# Patient Record
Sex: Female | Born: 1960 | Race: Black or African American | Hispanic: No | Marital: Single
Health system: Southern US, Community
[De-identification: ages and names within clinical notes are randomized; demographics above are authoritative.]

---

## 2004-11-02 ENCOUNTER — Ambulatory Visit: Payer: Self-pay | Admitting: Family Medicine

## 2005-02-14 ENCOUNTER — Ambulatory Visit: Payer: Self-pay | Admitting: Family Medicine

## 2006-02-20 ENCOUNTER — Ambulatory Visit: Payer: Self-pay | Admitting: Family Medicine

## 2006-06-13 ENCOUNTER — Ambulatory Visit: Payer: Self-pay | Admitting: Family Medicine

## 2009-03-10 ENCOUNTER — Ambulatory Visit: Payer: Self-pay | Admitting: Family Medicine

## 2009-03-16 ENCOUNTER — Ambulatory Visit: Payer: Self-pay | Admitting: Family Medicine

## 2009-03-24 ENCOUNTER — Emergency Department: Payer: Self-pay | Admitting: Emergency Medicine

## 2009-06-21 ENCOUNTER — Ambulatory Visit: Payer: Self-pay | Admitting: Gastroenterology

## 2009-06-23 ENCOUNTER — Inpatient Hospital Stay: Payer: Self-pay | Admitting: Internal Medicine

## 2009-07-26 ENCOUNTER — Ambulatory Visit: Payer: Self-pay | Admitting: General Surgery

## 2009-08-18 ENCOUNTER — Ambulatory Visit: Payer: Self-pay | Admitting: Gastroenterology

## 2009-08-26 ENCOUNTER — Ambulatory Visit: Payer: Self-pay | Admitting: Oncology

## 2009-09-01 ENCOUNTER — Ambulatory Visit: Payer: Self-pay | Admitting: General Surgery

## 2009-09-25 ENCOUNTER — Ambulatory Visit: Payer: Self-pay | Admitting: Internal Medicine

## 2009-09-25 ENCOUNTER — Ambulatory Visit: Payer: Self-pay | Admitting: Oncology

## 2009-09-25 ENCOUNTER — Ambulatory Visit: Payer: Self-pay | Admitting: General Surgery

## 2009-10-26 ENCOUNTER — Ambulatory Visit: Payer: Self-pay | Admitting: Oncology

## 2009-10-26 ENCOUNTER — Ambulatory Visit: Payer: Self-pay | Admitting: Internal Medicine

## 2009-11-10 ENCOUNTER — Ambulatory Visit: Payer: Self-pay | Admitting: Oncology

## 2009-11-25 ENCOUNTER — Ambulatory Visit: Payer: Self-pay | Admitting: Internal Medicine

## 2009-12-01 ENCOUNTER — Ambulatory Visit: Payer: Self-pay | Admitting: General Surgery

## 2009-12-26 ENCOUNTER — Ambulatory Visit: Payer: Self-pay | Admitting: Oncology

## 2009-12-26 ENCOUNTER — Ambulatory Visit: Payer: Self-pay | Admitting: Internal Medicine

## 2009-12-29 ENCOUNTER — Ambulatory Visit: Payer: Self-pay | Admitting: Internal Medicine

## 2010-01-12 ENCOUNTER — Ambulatory Visit: Payer: Self-pay | Admitting: Oncology

## 2010-01-26 ENCOUNTER — Ambulatory Visit: Payer: Self-pay | Admitting: Oncology

## 2010-01-26 ENCOUNTER — Ambulatory Visit: Payer: Self-pay | Admitting: Internal Medicine

## 2010-01-31 ENCOUNTER — Ambulatory Visit: Payer: Self-pay | Admitting: Surgery

## 2010-02-25 ENCOUNTER — Ambulatory Visit: Payer: Self-pay | Admitting: Oncology

## 2010-02-25 ENCOUNTER — Ambulatory Visit: Payer: Self-pay | Admitting: Internal Medicine

## 2010-03-28 ENCOUNTER — Ambulatory Visit: Payer: Self-pay | Admitting: Oncology

## 2010-03-28 ENCOUNTER — Ambulatory Visit: Payer: Self-pay | Admitting: Internal Medicine

## 2010-04-27 ENCOUNTER — Ambulatory Visit: Payer: Self-pay | Admitting: Internal Medicine

## 2010-04-27 ENCOUNTER — Ambulatory Visit: Payer: Self-pay | Admitting: Oncology

## 2010-05-28 ENCOUNTER — Ambulatory Visit: Payer: Self-pay | Admitting: Internal Medicine

## 2010-05-28 ENCOUNTER — Ambulatory Visit: Payer: Self-pay | Admitting: Oncology

## 2010-06-28 ENCOUNTER — Ambulatory Visit: Payer: Self-pay | Admitting: Oncology

## 2010-06-28 ENCOUNTER — Ambulatory Visit: Payer: Self-pay | Admitting: Internal Medicine

## 2010-07-27 ENCOUNTER — Ambulatory Visit: Payer: Self-pay | Admitting: Oncology

## 2010-07-27 ENCOUNTER — Ambulatory Visit: Payer: Self-pay | Admitting: Internal Medicine

## 2010-08-27 ENCOUNTER — Ambulatory Visit: Payer: Self-pay | Admitting: Oncology

## 2010-08-27 ENCOUNTER — Ambulatory Visit: Payer: Self-pay | Admitting: Internal Medicine

## 2010-09-26 ENCOUNTER — Ambulatory Visit: Payer: Self-pay | Admitting: Internal Medicine

## 2010-09-26 ENCOUNTER — Ambulatory Visit: Payer: Self-pay | Admitting: Oncology

## 2010-10-27 ENCOUNTER — Ambulatory Visit: Payer: Self-pay | Admitting: Oncology

## 2010-10-27 ENCOUNTER — Ambulatory Visit: Payer: Self-pay | Admitting: Internal Medicine

## 2010-11-26 ENCOUNTER — Ambulatory Visit: Payer: Self-pay | Admitting: Internal Medicine

## 2010-11-26 ENCOUNTER — Ambulatory Visit: Payer: Self-pay | Admitting: Oncology

## 2010-12-27 ENCOUNTER — Ambulatory Visit: Payer: Self-pay | Admitting: Internal Medicine

## 2010-12-27 ENCOUNTER — Ambulatory Visit: Payer: Self-pay | Admitting: Oncology

## 2011-01-27 ENCOUNTER — Ambulatory Visit: Payer: Self-pay | Admitting: Oncology

## 2011-01-27 ENCOUNTER — Ambulatory Visit: Payer: Self-pay | Admitting: Internal Medicine

## 2011-02-26 ENCOUNTER — Ambulatory Visit: Payer: Self-pay | Admitting: Oncology

## 2011-02-26 ENCOUNTER — Ambulatory Visit: Payer: Self-pay | Admitting: Internal Medicine

## 2011-03-06 ENCOUNTER — Ambulatory Visit: Payer: Self-pay | Admitting: General Surgery

## 2011-03-12 ENCOUNTER — Ambulatory Visit: Payer: Self-pay | Admitting: General Surgery

## 2011-03-14 LAB — PATHOLOGY REPORT

## 2011-03-29 ENCOUNTER — Ambulatory Visit: Payer: Self-pay | Admitting: Oncology

## 2011-06-19 ENCOUNTER — Ambulatory Visit: Payer: Self-pay | Admitting: Oncology

## 2011-06-19 LAB — COMPREHENSIVE METABOLIC PANEL
Albumin: 2.4 g/dL — ABNORMAL LOW (ref 3.4–5.0)
Alkaline Phosphatase: 95 U/L (ref 50–136)
BUN: 9 mg/dL (ref 7–18)
Creatinine: 1.22 mg/dL (ref 0.60–1.30)
EGFR (Non-African Amer.): 49 — ABNORMAL LOW
Glucose: 151 mg/dL — ABNORMAL HIGH (ref 65–99)
Osmolality: 290 (ref 275–301)
SGOT(AST): 16 U/L (ref 15–37)
SGPT (ALT): 10 U/L — ABNORMAL LOW
Sodium: 145 mmol/L (ref 136–145)
Total Protein: 6.3 g/dL — ABNORMAL LOW (ref 6.4–8.2)

## 2011-06-19 LAB — CBC CANCER CENTER
Basophil %: 1 %
Eosinophil #: 0.1 x10 3/mm (ref 0.0–0.7)
Eosinophil %: 1.4 %
HCT: 26.9 % — ABNORMAL LOW (ref 35.0–47.0)
HGB: 8.8 g/dL — ABNORMAL LOW (ref 12.0–16.0)
MCH: 30.3 pg (ref 26.0–34.0)
MCHC: 32.8 g/dL (ref 32.0–36.0)
MCV: 92 fL (ref 80–100)
Monocyte #: 0.5 x10 3/mm (ref 0.0–0.7)
Neutrophil #: 3.9 x10 3/mm (ref 1.4–6.5)
Neutrophil %: 68.6 %
Platelet: 216 x10 3/mm (ref 150–440)
RBC: 2.92 10*6/uL — ABNORMAL LOW (ref 3.80–5.20)

## 2011-06-29 ENCOUNTER — Ambulatory Visit: Payer: Self-pay | Admitting: Oncology

## 2011-07-12 LAB — CBC CANCER CENTER
Basophil %: 0.2 %
Eosinophil #: 0.1 x10 3/mm (ref 0.0–0.7)
HCT: 27.6 % — ABNORMAL LOW (ref 35.0–47.0)
HGB: 9 g/dL — ABNORMAL LOW (ref 12.0–16.0)
Lymphocyte #: 0.9 x10 3/mm — ABNORMAL LOW (ref 1.0–3.6)
Lymphocyte %: 17.6 %
MCH: 30.4 pg (ref 26.0–34.0)
MCHC: 32.8 g/dL (ref 32.0–36.0)
MCV: 93 fL (ref 80–100)
Monocyte #: 0.6 x10 3/mm (ref 0.0–0.7)
Neutrophil #: 3.6 x10 3/mm (ref 1.4–6.5)
RBC: 2.97 10*6/uL — ABNORMAL LOW (ref 3.80–5.20)
RDW: 15.4 % — ABNORMAL HIGH (ref 11.5–14.5)
WBC: 5.2 x10 3/mm (ref 3.6–11.0)

## 2011-07-12 LAB — COMPREHENSIVE METABOLIC PANEL
Albumin: 2.4 g/dL — ABNORMAL LOW (ref 3.4–5.0)
Alkaline Phosphatase: 403 U/L — ABNORMAL HIGH (ref 50–136)
Anion Gap: 8 (ref 7–16)
Bilirubin,Total: 0.2 mg/dL (ref 0.2–1.0)
Calcium, Total: 7.8 mg/dL — ABNORMAL LOW (ref 8.5–10.1)
Creatinine: 1.08 mg/dL (ref 0.60–1.30)
Glucose: 83 mg/dL (ref 65–99)
Osmolality: 282 (ref 275–301)
Potassium: 3.6 mmol/L (ref 3.5–5.1)
Sodium: 143 mmol/L (ref 136–145)
Total Protein: 6.9 g/dL (ref 6.4–8.2)

## 2011-07-26 LAB — CBC CANCER CENTER
Eosinophil: 5 %
HCT: 26.4 % — ABNORMAL LOW (ref 35.0–47.0)
Lymphocytes: 25 %
Platelet: 173 x10 3/mm (ref 150–440)
RDW: 14.8 % — ABNORMAL HIGH (ref 11.5–14.5)
Segmented Neutrophils: 63 %
WBC: 3.1 x10 3/mm — ABNORMAL LOW (ref 3.6–11.0)

## 2011-07-27 ENCOUNTER — Ambulatory Visit: Payer: Self-pay | Admitting: Oncology

## 2011-08-02 LAB — BASIC METABOLIC PANEL
Anion Gap: 9 (ref 7–16)
BUN: 7 mg/dL (ref 7–18)
Calcium, Total: 8 mg/dL — ABNORMAL LOW (ref 8.5–10.1)
Chloride: 106 mmol/L (ref 98–107)
Co2: 27 mmol/L (ref 21–32)
Creatinine: 0.97 mg/dL (ref 0.60–1.30)
Potassium: 3.3 mmol/L — ABNORMAL LOW (ref 3.5–5.1)

## 2011-08-02 LAB — CBC CANCER CENTER
Eosinophil: 3 %
Lymphocytes: 24 %
MCV: 93 fL (ref 80–100)
Monocytes: 14 %
Platelet: 249 x10 3/mm (ref 150–440)
RBC: 2.8 10*6/uL — ABNORMAL LOW (ref 3.80–5.20)
RDW: 16 % — ABNORMAL HIGH (ref 11.5–14.5)
Segmented Neutrophils: 57 %
WBC: 3.8 x10 3/mm (ref 3.6–11.0)

## 2011-08-09 LAB — CBC CANCER CENTER
Bands: 4 %
Basophil #: 0 x10 3/mm (ref 0.0–0.1)
Basophil %: 0.7 %
Eosinophil #: 0.1 x10 3/mm (ref 0.0–0.7)
Eosinophil %: 1.6 %
Eosinophil: 1 %
HCT: 27.8 % — ABNORMAL LOW (ref 35.0–47.0)
HGB: 9.2 g/dL — ABNORMAL LOW (ref 12.0–16.0)
Lymphocyte %: 18.7 %
Lymphocytes: 24 %
MCH: 31.4 pg (ref 26.0–34.0)
MCHC: 33.1 g/dL (ref 32.0–36.0)
Metamyelocyte: 1 %
Neutrophil #: 2.2 x10 3/mm (ref 1.4–6.5)
Neutrophil %: 49.7 %
RDW: 16.9 % — ABNORMAL HIGH (ref 11.5–14.5)
Segmented Neutrophils: 42 %

## 2011-08-16 LAB — BASIC METABOLIC PANEL
Anion Gap: 10 (ref 7–16)
BUN: 9 mg/dL (ref 7–18)
Calcium, Total: 8.1 mg/dL — ABNORMAL LOW (ref 8.5–10.1)
Co2: 31 mmol/L (ref 21–32)
EGFR (African American): >60
EGFR (Non-African Amer.): >60
Glucose: 103 mg/dL — ABNORMAL HIGH (ref 65–99)
Sodium: 145 mmol/L (ref 136–145)

## 2011-08-16 LAB — CBC CANCER CENTER
Eosinophil %: 0.7 %
HCT: 28.9 % — ABNORMAL LOW (ref 35.0–47.0)
Lymphocyte %: 16.2 %
Monocyte %: 10.8 %
Neutrophil #: 7.2 x10 3/mm — ABNORMAL HIGH (ref 1.4–6.5)
Neutrophil %: 72.2 %
Platelet: 250 x10 3/mm (ref 150–440)
RBC: 3.07 10*6/uL — ABNORMAL LOW (ref 3.80–5.20)
WBC: 10 x10 3/mm (ref 3.6–11.0)

## 2011-08-27 ENCOUNTER — Ambulatory Visit: Payer: Self-pay | Admitting: Oncology

## 2011-08-30 LAB — CBC CANCER CENTER
Basophil #: 0 x10 3/mm (ref 0.0–0.1)
Basophil %: 0.1 %
HGB: 9.2 g/dL — ABNORMAL LOW (ref 12.0–16.0)
Lymphocyte #: 0.9 x10 3/mm — ABNORMAL LOW (ref 1.0–3.6)
Lymphocyte %: 13.1 %
MCHC: 33.5 g/dL (ref 32.0–36.0)
MCV: 95 fL (ref 80–100)
Monocyte %: 10.6 %
Neutrophil %: 73.4 %
Platelet: 230 x10 3/mm (ref 150–440)
WBC: 7.1 x10 3/mm (ref 3.6–11.0)

## 2011-08-30 LAB — BASIC METABOLIC PANEL
Anion Gap: 9 (ref 7–16)
Calcium, Total: 8 mg/dL — ABNORMAL LOW (ref 8.5–10.1)
Chloride: 109 mmol/L — ABNORMAL HIGH (ref 98–107)
Co2: 26 mmol/L (ref 21–32)
Creatinine: 1.19 mg/dL (ref 0.60–1.30)
EGFR (African American): >60
Osmolality: 287 (ref 275–301)
Potassium: 3.4 mmol/L — ABNORMAL LOW (ref 3.5–5.1)

## 2011-09-13 LAB — CBC CANCER CENTER
Eosinophil #: 0.1 x10 3/mm (ref 0.0–0.7)
Eosinophil %: 1.7 %
HCT: 29.6 % — ABNORMAL LOW (ref 35.0–47.0)
HGB: 9.6 g/dL — ABNORMAL LOW (ref 12.0–16.0)
Lymphocyte #: 1 x10 3/mm (ref 1.0–3.6)
Lymphocyte %: 18.5 %
MCHC: 32.5 g/dL (ref 32.0–36.0)
MCV: 98 fL (ref 80–100)
Monocyte #: 0.7 x10 3/mm (ref 0.2–0.9)
Monocyte %: 11.9 %
Neutrophil %: 66.9 %
WBC: 5.7 x10 3/mm (ref 3.6–11.0)

## 2011-09-13 LAB — BASIC METABOLIC PANEL
Anion Gap: 8 (ref 7–16)
BUN: 7 mg/dL (ref 7–18)
Chloride: 105 mmol/L (ref 98–107)
EGFR (African American): >60
EGFR (Non-African Amer.): >60
Glucose: 145 mg/dL — ABNORMAL HIGH (ref 65–99)
Osmolality: 286 (ref 275–301)
Potassium: 3.2 mmol/L — ABNORMAL LOW (ref 3.5–5.1)

## 2011-09-26 ENCOUNTER — Ambulatory Visit: Payer: Self-pay | Admitting: Oncology

## 2011-09-27 LAB — CBC CANCER CENTER
Basophil %: 0.9 %
Eosinophil #: 0.1 x10 3/mm (ref 0.0–0.7)
Eosinophil %: 1.7 %
MCHC: 31.6 g/dL — ABNORMAL LOW (ref 32.0–36.0)
MCV: 100 fL (ref 80–100)
Monocyte #: 0.7 x10 3/mm (ref 0.2–0.9)
WBC: 5.3 x10 3/mm (ref 3.6–11.0)

## 2011-09-27 LAB — BASIC METABOLIC PANEL
Anion Gap: 6 — ABNORMAL LOW (ref 7–16)
BUN: 7 mg/dL (ref 7–18)
Calcium, Total: 8 mg/dL — ABNORMAL LOW (ref 8.5–10.1)
Creatinine: 0.98 mg/dL (ref 0.60–1.30)
EGFR (Non-African Amer.): >60
Osmolality: 282 (ref 275–301)
Potassium: 4.1 mmol/L (ref 3.5–5.1)

## 2011-10-11 LAB — COMPREHENSIVE METABOLIC PANEL
Albumin: 2.2 g/dL — ABNORMAL LOW (ref 3.4–5.0)
Alkaline Phosphatase: 421 U/L — ABNORMAL HIGH (ref 50–136)
Anion Gap: 8 (ref 7–16)
BUN: 9 mg/dL (ref 7–18)
Chloride: 108 mmol/L — ABNORMAL HIGH (ref 98–107)
Co2: 27 mmol/L (ref 21–32)
Creatinine: 1.16 mg/dL (ref 0.60–1.30)
EGFR (African American): >60
EGFR (Non-African Amer.): 54 — ABNORMAL LOW
Glucose: 95 mg/dL (ref 65–99)
Osmolality: 283 (ref 275–301)
Potassium: 3.8 mmol/L (ref 3.5–5.1)
SGOT(AST): 27 U/L (ref 15–37)
Sodium: 143 mmol/L (ref 136–145)

## 2011-10-11 LAB — CBC CANCER CENTER
Eosinophil #: 0 x10 3/mm (ref 0.0–0.7)
HCT: 28.5 % — ABNORMAL LOW (ref 35.0–47.0)
HGB: 9.3 g/dL — ABNORMAL LOW (ref 12.0–16.0)
Lymphocyte #: 0.8 x10 3/mm — ABNORMAL LOW (ref 1.0–3.6)
Lymphocyte %: 16.2 %
MCH: 32.5 pg (ref 26.0–34.0)
MCHC: 32.7 g/dL (ref 32.0–36.0)
MCV: 99 fL (ref 80–100)
Monocyte #: 0.4 x10 3/mm (ref 0.2–0.9)
Monocyte %: 9.1 %
Neutrophil #: 3.4 x10 3/mm (ref 1.4–6.5)
Platelet: 170 x10 3/mm (ref 150–440)
RBC: 2.87 10*6/uL — ABNORMAL LOW (ref 3.80–5.20)

## 2011-10-16 LAB — CANCER ANTIGEN 19-9: CA 19-9: <1 U/mL

## 2011-10-18 LAB — CBC CANCER CENTER
Basophil #: 0 x10 3/mm (ref 0.0–0.1)
Eosinophil #: 0 x10 3/mm (ref 0.0–0.7)
HGB: 9 g/dL — ABNORMAL LOW (ref 12.0–16.0)
Lymphocyte #: 1.1 x10 3/mm (ref 1.0–3.6)
Lymphocyte %: 31.9 %
MCH: 32.4 pg (ref 26.0–34.0)
MCHC: 32.3 g/dL (ref 32.0–36.0)
MCV: 100 fL (ref 80–100)
Monocyte #: 0.6 x10 3/mm (ref 0.2–0.9)
Monocyte %: 18 %
Platelet: 196 x10 3/mm (ref 150–440)
RDW: 16.5 % — ABNORMAL HIGH (ref 11.5–14.5)

## 2011-10-18 LAB — COMPREHENSIVE METABOLIC PANEL
Albumin: 2.1 g/dL — ABNORMAL LOW (ref 3.4–5.0)
Alkaline Phosphatase: 380 U/L — ABNORMAL HIGH (ref 50–136)
Calcium, Total: 7.3 mg/dL — ABNORMAL LOW (ref 8.5–10.1)
Creatinine: 0.94 mg/dL (ref 0.60–1.30)
EGFR (African American): >60
EGFR (Non-African Amer.): >60
Glucose: 122 mg/dL — ABNORMAL HIGH (ref 65–99)
Osmolality: 290 (ref 275–301)
Potassium: 3.1 mmol/L — ABNORMAL LOW (ref 3.5–5.1)
SGPT (ALT): 11 U/L — ABNORMAL LOW
Sodium: 146 mmol/L — ABNORMAL HIGH (ref 136–145)
Total Protein: 6.6 g/dL (ref 6.4–8.2)

## 2011-10-18 LAB — URINALYSIS, COMPLETE
Bacteria: NONE SEEN
Bilirubin,UR: NEGATIVE
Blood: NEGATIVE
Glucose,UR: NEGATIVE mg/dL (ref 0–75)
Nitrite: NEGATIVE
Protein: NEGATIVE
Specific Gravity: 1.008 (ref 1.003–1.030)
WBC UR: <1 /HPF (ref 0–5)

## 2011-10-27 ENCOUNTER — Ambulatory Visit: Payer: Self-pay | Admitting: Oncology

## 2011-10-31 LAB — CBC CANCER CENTER
Basophil %: 1.1 %
Eosinophil #: 0 x10 3/mm (ref 0.0–0.7)
Eosinophil %: 0.9 %
HGB: 9.2 g/dL — ABNORMAL LOW (ref 12.0–16.0)
Lymphocyte #: 1.2 x10 3/mm (ref 1.0–3.6)
Lymphocyte %: 24.3 %
MCHC: 32.6 g/dL (ref 32.0–36.0)
MCV: 97 fL (ref 80–100)
Monocyte #: 0.7 x10 3/mm (ref 0.2–0.9)
Platelet: 271 x10 3/mm (ref 150–440)
RDW: 15.8 % — ABNORMAL HIGH (ref 11.5–14.5)

## 2011-10-31 LAB — COMPREHENSIVE METABOLIC PANEL
Albumin: 2.1 g/dL — ABNORMAL LOW (ref 3.4–5.0)
Alkaline Phosphatase: 541 U/L — ABNORMAL HIGH (ref 50–136)
Anion Gap: 6 — ABNORMAL LOW (ref 7–16)
BUN: 10 mg/dL (ref 7–18)
Calcium, Total: 7.5 mg/dL — ABNORMAL LOW (ref 8.5–10.1)
Chloride: 110 mmol/L — ABNORMAL HIGH (ref 98–107)
Co2: 29 mmol/L (ref 21–32)
EGFR (African American): >60
EGFR (Non-African Amer.): >60
Glucose: 83 mg/dL (ref 65–99)
Osmolality: 287 (ref 275–301)
SGPT (ALT): 22 U/L
Sodium: 145 mmol/L (ref 136–145)

## 2011-11-14 LAB — COMPREHENSIVE METABOLIC PANEL
Albumin: 1.8 g/dL — ABNORMAL LOW (ref 3.4–5.0)
Calcium, Total: 7.5 mg/dL — ABNORMAL LOW (ref 8.5–10.1)
Chloride: 115 mmol/L — ABNORMAL HIGH (ref 98–107)
Creatinine: 1.3 mg/dL (ref 0.60–1.30)
EGFR (African American): 55 — ABNORMAL LOW
EGFR (Non-African Amer.): 47 — ABNORMAL LOW
Glucose: 119 mg/dL — ABNORMAL HIGH (ref 65–99)
Osmolality: 292 (ref 275–301)
Potassium: 3.8 mmol/L (ref 3.5–5.1)
SGOT(AST): 21 U/L (ref 15–37)
Sodium: 147 mmol/L — ABNORMAL HIGH (ref 136–145)
Total Protein: 6.5 g/dL (ref 6.4–8.2)

## 2011-11-14 LAB — CBC CANCER CENTER
Basophil #: 0 x10 3/mm (ref 0.0–0.1)
Eosinophil #: 0.1 x10 3/mm (ref 0.0–0.7)
Eosinophil %: 1.2 %
HCT: 24.2 % — ABNORMAL LOW (ref 35.0–47.0)
MCHC: 32.5 g/dL (ref 32.0–36.0)
RDW: 16.3 % — ABNORMAL HIGH (ref 11.5–14.5)
WBC: 4.8 x10 3/mm (ref 3.6–11.0)

## 2011-11-19 LAB — CBC CANCER CENTER
Basophil #: 0.1 x10 3/mm (ref 0.0–0.1)
Basophil %: 1.1 %
Eosinophil #: 0.1 x10 3/mm (ref 0.0–0.7)
Eosinophil %: 1.3 %
HCT: 24.5 % — ABNORMAL LOW (ref 35.0–47.0)
HGB: 7.9 g/dL — ABNORMAL LOW (ref 12.0–16.0)
Lymphocyte #: 1 x10 3/mm (ref 1.0–3.6)
Lymphocyte %: 20.9 %
MCH: 31.8 pg (ref 26.0–34.0)
MCHC: 32.1 g/dL (ref 32.0–36.0)
Monocyte #: 0.7 x10 3/mm (ref 0.2–0.9)
Neutrophil #: 2.9 x10 3/mm (ref 1.4–6.5)
Platelet: 508 x10 3/mm — ABNORMAL HIGH (ref 150–440)
RBC: 2.48 10*6/uL — ABNORMAL LOW (ref 3.80–5.20)
RDW: 18 % — ABNORMAL HIGH (ref 11.5–14.5)

## 2011-11-26 ENCOUNTER — Ambulatory Visit: Payer: Self-pay | Admitting: Oncology

## 2011-11-26 LAB — COMPREHENSIVE METABOLIC PANEL
Albumin: 1.8 g/dL — ABNORMAL LOW (ref 3.4–5.0)
Alkaline Phosphatase: 578 U/L — ABNORMAL HIGH (ref 50–136)
Anion Gap: 8 (ref 7–16)
Bilirubin,Total: 0.5 mg/dL (ref 0.2–1.0)
Chloride: 111 mmol/L — ABNORMAL HIGH (ref 98–107)
EGFR (African American): 51 — ABNORMAL LOW
Glucose: 111 mg/dL — ABNORMAL HIGH (ref 65–99)
Osmolality: 287 (ref 275–301)
Potassium: 3.4 mmol/L — ABNORMAL LOW (ref 3.5–5.1)
Sodium: 145 mmol/L (ref 136–145)
Total Protein: 6.5 g/dL (ref 6.4–8.2)

## 2011-11-26 LAB — CBC CANCER CENTER
Basophil %: 1 %
Eosinophil #: 0 x10 3/mm (ref 0.0–0.7)
Eosinophil %: 0.4 %
HCT: 26.2 % — ABNORMAL LOW (ref 35.0–47.0)
HGB: 8.7 g/dL — ABNORMAL LOW (ref 12.0–16.0)
Lymphocyte %: 12.1 %
MCHC: 33.3 g/dL (ref 32.0–36.0)
Monocyte %: 14.2 %
Neutrophil #: 4.3 x10 3/mm (ref 1.4–6.5)
Neutrophil %: 72.3 %
Platelet: 334 x10 3/mm (ref 150–440)
RBC: 2.66 10*6/uL — ABNORMAL LOW (ref 3.80–5.20)
WBC: 5.9 x10 3/mm (ref 3.6–11.0)

## 2011-11-30 ENCOUNTER — Observation Stay: Payer: Self-pay | Admitting: Internal Medicine

## 2011-11-30 LAB — CBC
HCT: 27.6 % — ABNORMAL LOW (ref 35.0–47.0)
HGB: 8.9 g/dL — ABNORMAL LOW (ref 12.0–16.0)
MCH: 32.1 pg (ref 26.0–34.0)
MCV: 99 fL (ref 80–100)
Platelet: 185 10*3/uL (ref 150–440)
RBC: 2.78 10*6/uL — ABNORMAL LOW (ref 3.80–5.20)

## 2011-11-30 LAB — COMPREHENSIVE METABOLIC PANEL
Alkaline Phosphatase: 575 U/L — ABNORMAL HIGH (ref 50–136)
BUN: 10 mg/dL (ref 7–18)
Bilirubin,Total: 0.5 mg/dL (ref 0.2–1.0)
Calcium, Total: 7.5 mg/dL — ABNORMAL LOW (ref 8.5–10.1)
Chloride: 110 mmol/L — ABNORMAL HIGH (ref 98–107)
Co2: 27 mmol/L (ref 21–32)
EGFR (African American): >60
EGFR (Non-African Amer.): >60
Glucose: 52 mg/dL — ABNORMAL LOW (ref 65–99)
Osmolality: 278 (ref 275–301)
Potassium: 3.1 mmol/L — ABNORMAL LOW (ref 3.5–5.1)
SGOT(AST): 74 U/L — ABNORMAL HIGH (ref 15–37)
SGPT (ALT): 30 U/L

## 2011-11-30 LAB — CK TOTAL AND CKMB (NOT AT ARMC)
CK, Total: 63 U/L (ref 21–215)
CK-MB: <0.5 ng/mL — ABNORMAL LOW (ref 0.5–3.6)

## 2011-11-30 LAB — TROPONIN I: Troponin-I: <0.02 ng/mL

## 2011-12-03 LAB — CBC CANCER CENTER
Basophil #: 0 x10 3/mm (ref 0.0–0.1)
Eosinophil %: 0.2 %
HCT: 22 % — ABNORMAL LOW (ref 35.0–47.0)
Lymphocyte %: 8.5 %
MCH: 32.2 pg (ref 26.0–34.0)
MCHC: 32.4 g/dL (ref 32.0–36.0)
Monocyte #: 0.3 x10 3/mm (ref 0.2–0.9)
Monocyte %: 4.8 %
Neutrophil %: 86.4 %
Platelet: 77 x10 3/mm — ABNORMAL LOW (ref 150–440)
RBC: 2.21 10*6/uL — ABNORMAL LOW (ref 3.80–5.20)
WBC: 6.7 x10 3/mm (ref 3.6–11.0)

## 2011-12-03 LAB — BASIC METABOLIC PANEL
BUN: 11 mg/dL (ref 7–18)
Creatinine: 0.9 mg/dL (ref 0.60–1.30)
Glucose: 73 mg/dL (ref 65–99)
Potassium: 2.9 mmol/L — ABNORMAL LOW (ref 3.5–5.1)
Sodium: 143 mmol/L (ref 136–145)

## 2011-12-10 ENCOUNTER — Ambulatory Visit: Payer: Self-pay | Admitting: Oncology

## 2011-12-10 LAB — CBC CANCER CENTER
Basophil #: 0.1 x10 3/mm (ref 0.0–0.1)
Basophil %: 0.8 %
Eosinophil #: 0 x10 3/mm (ref 0.0–0.7)
HCT: 35.3 % (ref 35.0–47.0)
HGB: 11.7 g/dL — ABNORMAL LOW (ref 12.0–16.0)
Lymphocyte %: 12 %
MCH: 31.5 pg (ref 26.0–34.0)
MCV: 95 fL (ref 80–100)
Monocyte %: 10.6 %
Neutrophil #: 6.7 x10 3/mm — ABNORMAL HIGH (ref 1.4–6.5)
RBC: 3.71 10*6/uL — ABNORMAL LOW (ref 3.80–5.20)
WBC: 8.8 x10 3/mm (ref 3.6–11.0)

## 2011-12-10 LAB — COMPREHENSIVE METABOLIC PANEL
Albumin: 1.8 g/dL — ABNORMAL LOW (ref 3.4–5.0)
Alkaline Phosphatase: 645 U/L — ABNORMAL HIGH (ref 50–136)
Anion Gap: 11 (ref 7–16)
BUN: 5 mg/dL — ABNORMAL LOW (ref 7–18)
Chloride: 105 mmol/L (ref 98–107)
Creatinine: 0.95 mg/dL (ref 0.60–1.30)
Glucose: 102 mg/dL — ABNORMAL HIGH (ref 65–99)
Osmolality: 282 (ref 275–301)
Potassium: 2.5 mmol/L — CL (ref 3.5–5.1)
Sodium: 143 mmol/L (ref 136–145)
Total Protein: 6.6 g/dL (ref 6.4–8.2)

## 2011-12-10 LAB — PROTIME-INR
INR: 2.1
Prothrombin Time: 23.7 secs — ABNORMAL HIGH (ref 11.5–14.7)

## 2011-12-18 LAB — COMPREHENSIVE METABOLIC PANEL
Alkaline Phosphatase: 603 U/L — ABNORMAL HIGH (ref 50–136)
Anion Gap: 4 — ABNORMAL LOW (ref 7–16)
BUN: 9 mg/dL (ref 7–18)
Bilirubin,Total: 0.4 mg/dL (ref 0.2–1.0)
Chloride: 108 mmol/L — ABNORMAL HIGH (ref 98–107)
Creatinine: 1.1 mg/dL (ref 0.60–1.30)
EGFR (African American): >60
EGFR (Non-African Amer.): 58 — ABNORMAL LOW
Glucose: 109 mg/dL — ABNORMAL HIGH (ref 65–99)
Potassium: 3.5 mmol/L (ref 3.5–5.1)
Sodium: 143 mmol/L (ref 136–145)
Total Protein: 6.1 g/dL — ABNORMAL LOW (ref 6.4–8.2)

## 2011-12-18 LAB — CBC CANCER CENTER
Eosinophil #: 0 x10 3/mm (ref 0.0–0.7)
Eosinophil %: 0.3 %
Lymphocyte #: 0.6 x10 3/mm — ABNORMAL LOW (ref 1.0–3.6)
MCH: 32.5 pg (ref 26.0–34.0)
MCHC: 32.9 g/dL (ref 32.0–36.0)
MCV: 99 fL (ref 80–100)
Monocyte #: 0.4 x10 3/mm (ref 0.2–0.9)
Neutrophil %: 59.1 %
Platelet: 212 x10 3/mm (ref 150–440)
RBC: 2.67 10*6/uL — ABNORMAL LOW (ref 3.80–5.20)

## 2011-12-25 ENCOUNTER — Inpatient Hospital Stay: Payer: Self-pay | Admitting: Oncology

## 2011-12-25 LAB — COMPREHENSIVE METABOLIC PANEL
Alkaline Phosphatase: 335 U/L — ABNORMAL HIGH (ref 50–136)
Anion Gap: 5 — ABNORMAL LOW (ref 7–16)
BUN: 13 mg/dL (ref 7–18)
Bilirubin,Total: 0.5 mg/dL (ref 0.2–1.0)
Calcium, Total: 8 mg/dL — ABNORMAL LOW (ref 8.5–10.1)
Chloride: 101 mmol/L (ref 98–107)
Creatinine: 0.69 mg/dL (ref 0.60–1.30)
EGFR (African American): >60
EGFR (Non-African Amer.): >60
Glucose: 44 mg/dL — ABNORMAL LOW (ref 65–99)
Osmolality: 275 (ref 275–301)
Potassium: 3.7 mmol/L (ref 3.5–5.1)
Sodium: 139 mmol/L (ref 136–145)

## 2011-12-25 LAB — CBC CANCER CENTER
Eosinophil %: 6.2 %
Lymphocyte #: 0.3 x10 3/mm — ABNORMAL LOW (ref 1.0–3.6)
MCV: 99 fL (ref 80–100)
Monocyte #: 0 x10 3/mm — ABNORMAL LOW (ref 0.2–0.9)
Neutrophil #: 0 x10 3/mm — ABNORMAL LOW (ref 1.4–6.5)
Platelet: 36 x10 3/mm — ABNORMAL LOW (ref 150–440)
RBC: 2.42 10*6/uL — ABNORMAL LOW (ref 3.80–5.20)
RDW: 16.4 % — ABNORMAL HIGH (ref 11.5–14.5)
WBC: 0.3 x10 3/mm — CL (ref 3.6–11.0)

## 2011-12-26 LAB — BASIC METABOLIC PANEL
BUN: 11 mg/dL (ref 7–18)
Co2: 30 mmol/L (ref 21–32)
Creatinine: 0.75 mg/dL (ref 0.60–1.30)
EGFR (African American): >60
Sodium: 134 mmol/L — ABNORMAL LOW (ref 136–145)

## 2011-12-26 LAB — CBC WITH DIFFERENTIAL/PLATELET
Lymphocytes: 13 %
MCHC: 35 g/dL (ref 32.0–36.0)
Platelet: 25 10*3/uL — CL (ref 150–440)
RDW: 16.1 % — ABNORMAL HIGH (ref 11.5–14.5)
Segmented Neutrophils: 1 %
Variant Lymphocyte - H1-Rlymph: 11 %
WBC: 0.5 10*3/uL — CL (ref 3.6–11.0)

## 2011-12-26 LAB — URINALYSIS, COMPLETE
Bacteria: NONE SEEN
Bilirubin,UR: NEGATIVE
Blood: NEGATIVE
Glucose,UR: NEGATIVE mg/dL (ref 0–75)
Leukocyte Esterase: NEGATIVE
Ph: 5 (ref 4.5–8.0)
Specific Gravity: 1.032 (ref 1.003–1.030)
Squamous Epithelial: 1
WBC UR: 2 /HPF (ref 0–5)

## 2011-12-27 ENCOUNTER — Ambulatory Visit: Payer: Self-pay | Admitting: Oncology

## 2011-12-27 LAB — CBC WITH DIFFERENTIAL/PLATELET
Basophil #: 0 10*3/uL (ref 0.0–0.1)
Basophil %: 0.4 %
Eosinophil #: 0.1 10*3/uL (ref 0.0–0.7)
HCT: 22.5 % — ABNORMAL LOW (ref 35.0–47.0)
HGB: 7.7 g/dL — ABNORMAL LOW (ref 12.0–16.0)
Lymphocyte #: 0.6 10*3/uL — ABNORMAL LOW (ref 1.0–3.6)
MCH: 33.1 pg (ref 26.0–34.0)
MCHC: 34 g/dL (ref 32.0–36.0)
MCV: 98 fL (ref 80–100)
Monocyte %: 26.4 %
Neutrophil #: 0.1 10*3/uL — ABNORMAL LOW (ref 1.4–6.5)
RDW: 16.2 % — ABNORMAL HIGH (ref 11.5–14.5)
WBC: 1 10*3/uL — CL (ref 3.6–11.0)

## 2011-12-28 LAB — URINE CULTURE

## 2011-12-29 ENCOUNTER — Ambulatory Visit: Payer: Self-pay | Admitting: Oncology

## 2012-01-08 ENCOUNTER — Ambulatory Visit: Payer: Self-pay | Admitting: Oncology

## 2012-01-08 LAB — PLATELET COUNT: Platelet: 294 10*3/uL (ref 150–440)

## 2012-01-08 LAB — PROTIME-INR
INR: 1.4
Prothrombin Time: 17.2 secs — ABNORMAL HIGH (ref 11.5–14.7)

## 2012-01-08 LAB — APTT: Activated PTT: 40.5 secs — ABNORMAL HIGH (ref 23.6–35.9)

## 2012-01-27 ENCOUNTER — Ambulatory Visit: Payer: Self-pay | Admitting: Oncology

## 2012-01-30 ENCOUNTER — Ambulatory Visit: Payer: Self-pay | Admitting: Oncology

## 2012-02-12 ENCOUNTER — Ambulatory Visit: Payer: Self-pay | Admitting: General Surgery

## 2012-02-12 LAB — BASIC METABOLIC PANEL
Anion Gap: 9 (ref 7–16)
BUN: 11 mg/dL (ref 7–18)
Chloride: 100 mmol/L (ref 98–107)
Co2: 29 mmol/L (ref 21–32)
Creatinine: 0.8 mg/dL (ref 0.60–1.30)
EGFR (African American): >60
Osmolality: 272 (ref 275–301)
Sodium: 138 mmol/L (ref 136–145)

## 2012-02-12 LAB — PROTIME-INR: INR: 1.4

## 2012-02-12 LAB — PLATELET COUNT: Platelet: 215 10*3/uL (ref 150–440)

## 2012-02-19 ENCOUNTER — Ambulatory Visit: Payer: Self-pay | Admitting: General Surgery

## 2012-03-28 DEATH — deceased

## 2013-05-17 IMAGING — US US EXTREM LOW VENOUS BILAT
1 series · 17 of 24 positions shown · non-contrast
Comparison: none

REASON FOR EXAM: CR  [id number]  bil leg swelling  eval for DVT
COMMENTS:

PROCEDURE:     US  - US DOPPLER LOW EXTR BILATERAL  - October 05, 2011  [DATE]
RESULT:     Comparison: None

[Series 1: us extrem low venous bilat · 17 of 65 slices shown]
[im 1/65]
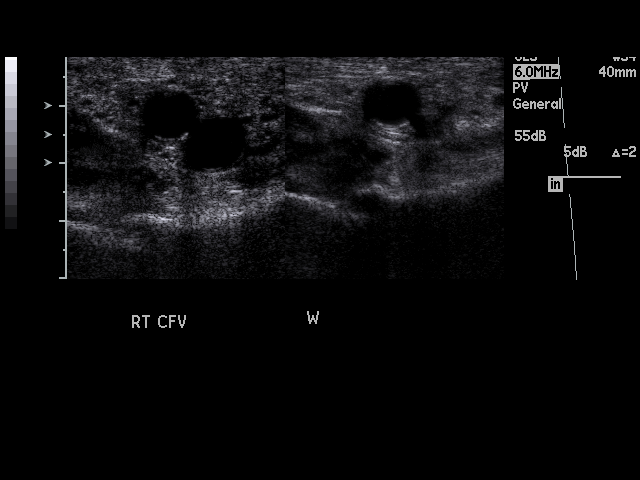
[im 6/65]
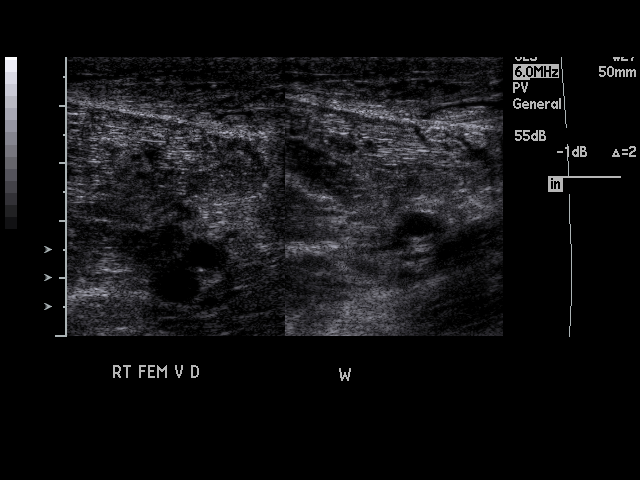
[im 9/65]
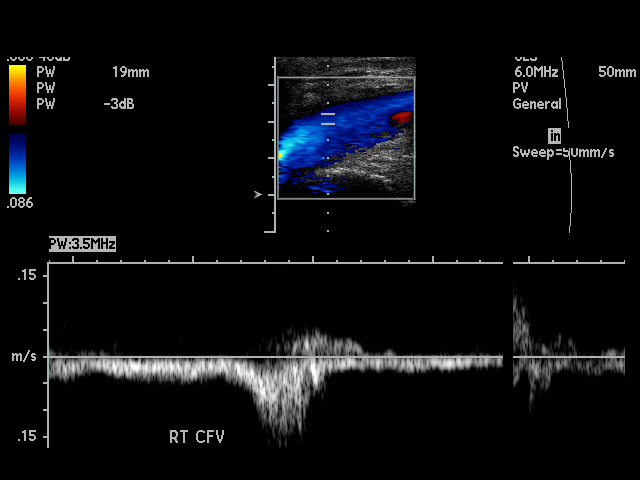
[im 12/65]
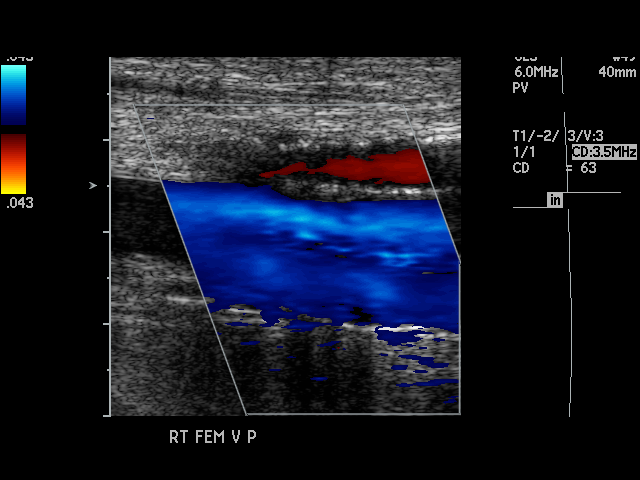
[im 17/65]
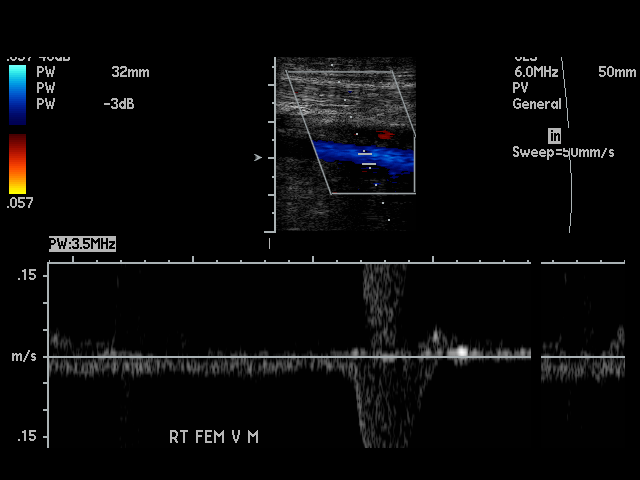
[im 20/65]
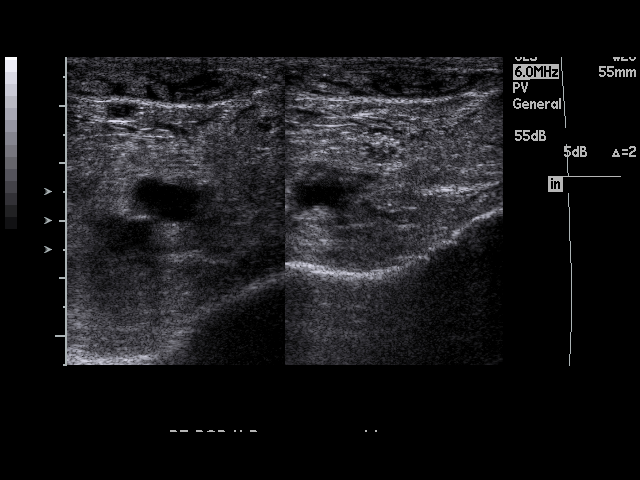
[im 26/65]
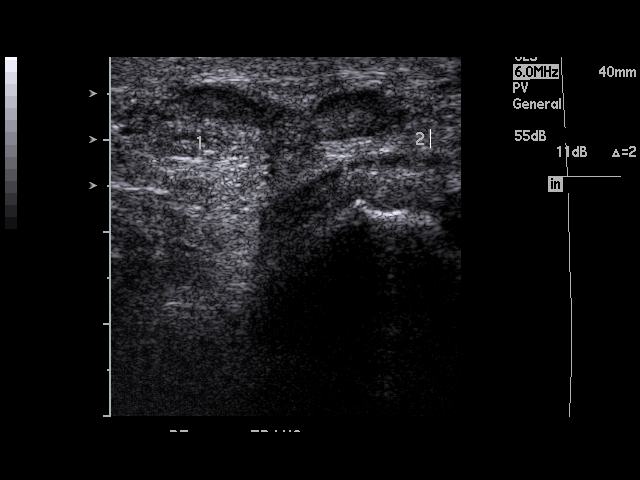
[im 28/65]
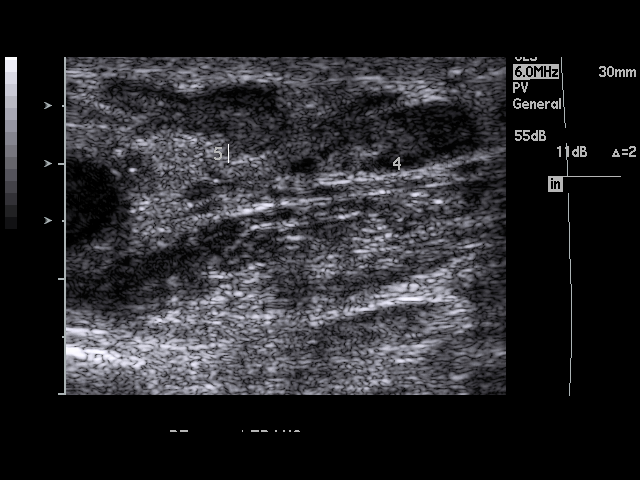
[im 34/65]
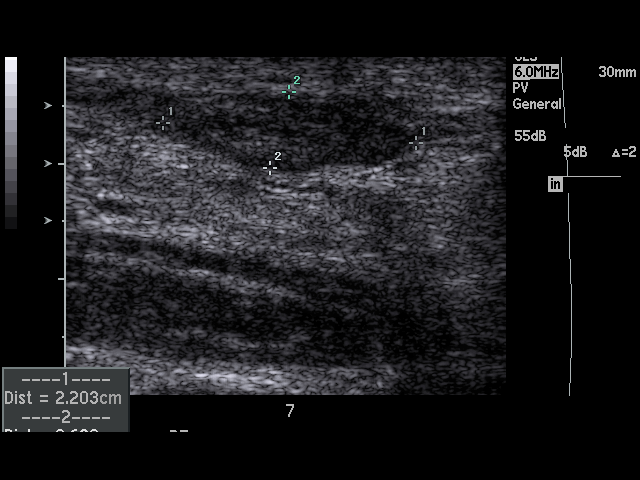
[im 37/65]
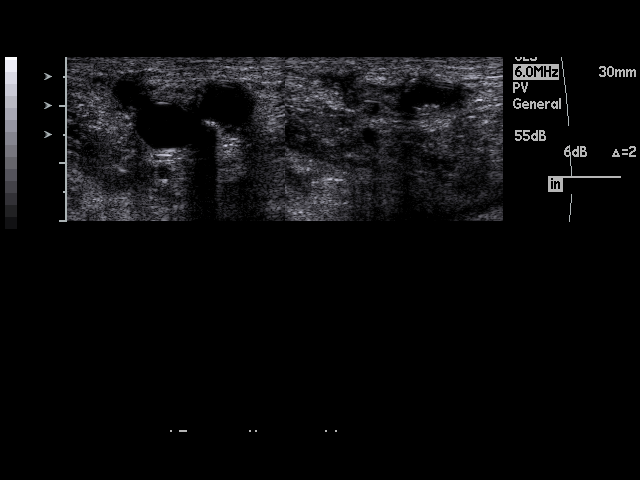
[im 39/65]
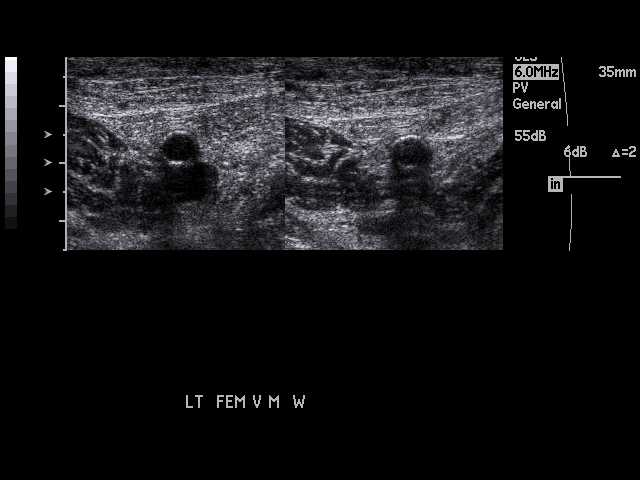
[im 45/65]
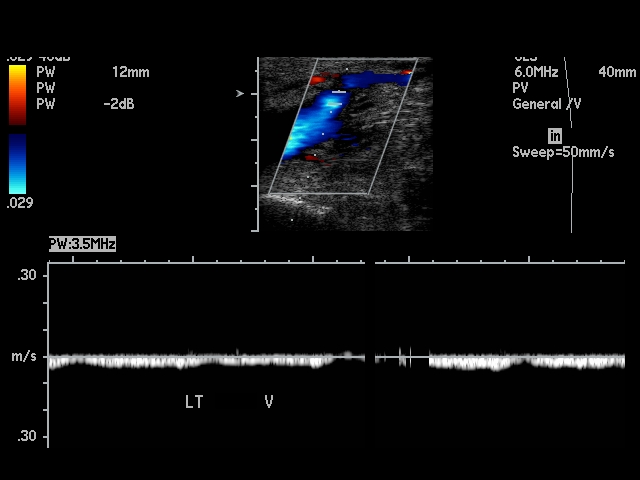
[im 48/65]
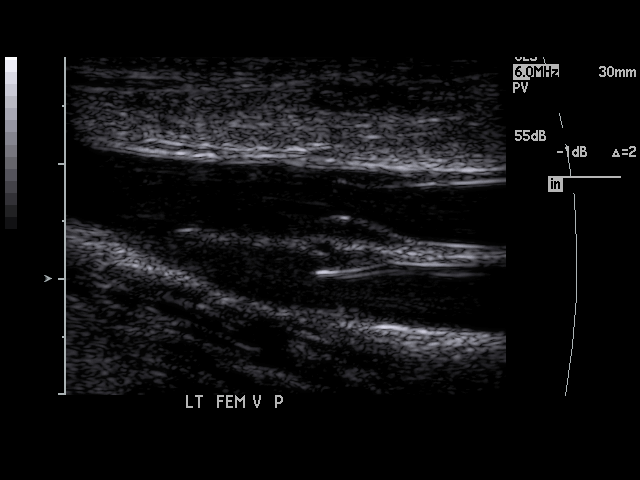
[im 53/65]
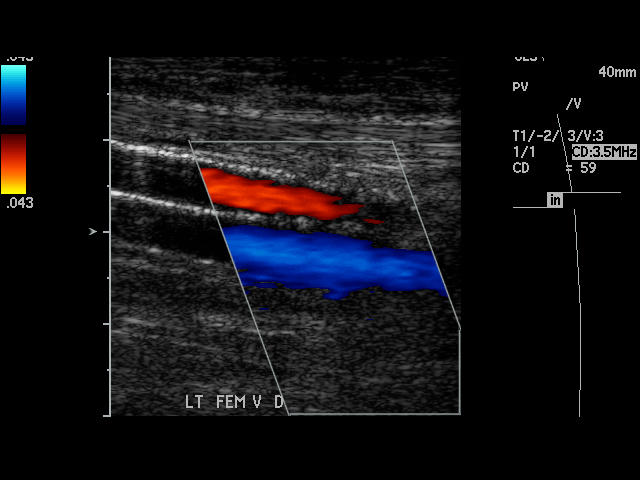
[im 56/65]
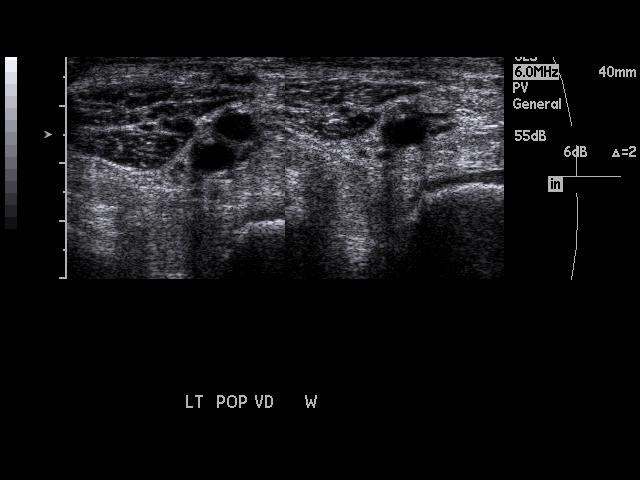
[im 59/65]
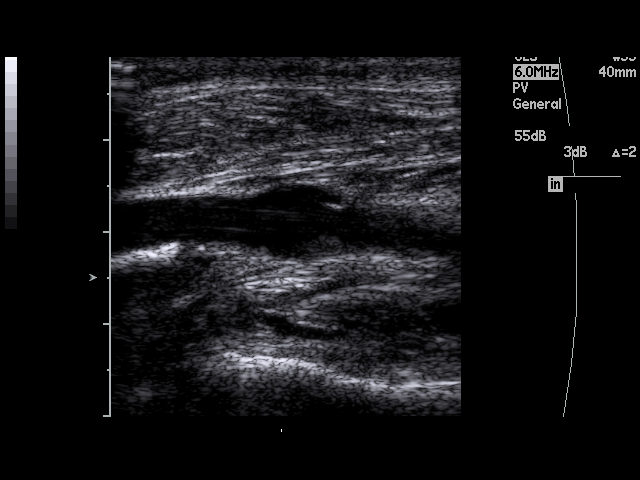
[im 65/65]
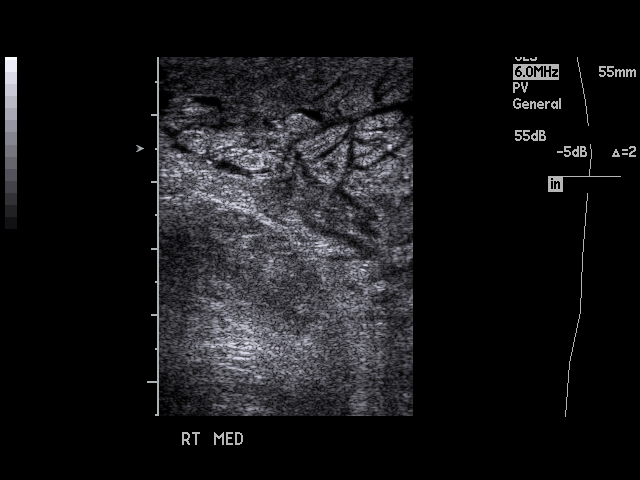

[17 of 24 positions shown; findings below may reference images not displayed]

FINDINGS: Multiple longitudinal and transverse gray-scale as well as color
and spectral Doppler images of the bilateral lower extremity veins were
obtained from the common femoral veins through the popliteal veins.

The bilateral common femoral, femoral, and popliteal veins are patent,
demonstrating normal color-flow and compressibility. No intraluminal
thrombus is identified.There is normal respiratory variation and
augmentation demonstrated at all vein levels.

There is diffuse edema in the lower extremities bilaterally. There are
several lymph nodes in the right groin which are subcentimeter in short axis
and and most of which appear to demonstrate a fatty hilum. These are
nonspecific. There is slow flow in the vessels of the left lower extremity.
IMPRESSION: No evidence of DVT in the bilateral lower extremities.

## 2013-07-12 IMAGING — CT CT CHEST W/ CM
1 series · 15 of 33 positions shown, 19 images · non-contrast
Comparison: none

REASON FOR EXAM: SHORT OF BREATH AND HAS CANCER
COMMENTS:

[Series 7: soft tissue · axial · 0.74mm/px · z∈[+256,+512]mm · 15 of 101 slices shown, 19 images]
[im 8/101  mediastinal]
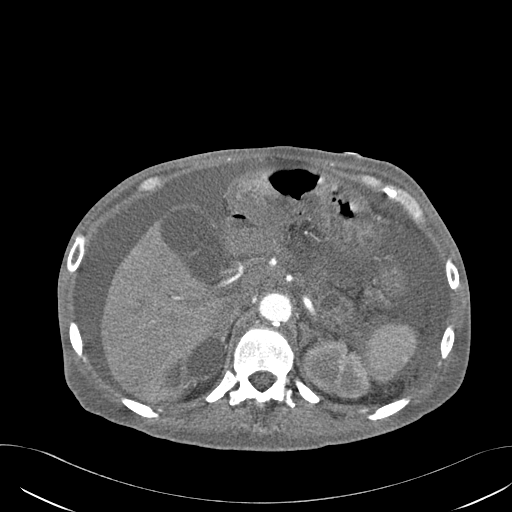
[im 8/101  lung]
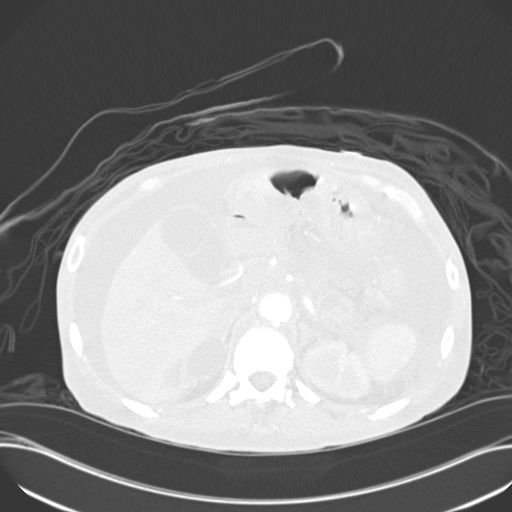
[im 15/101  lung]
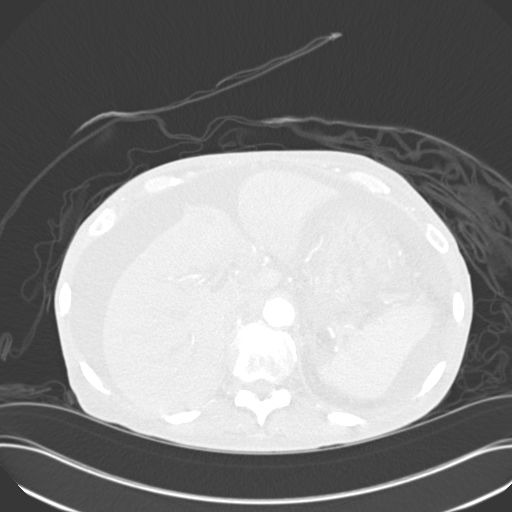
[im 21/101  lung]
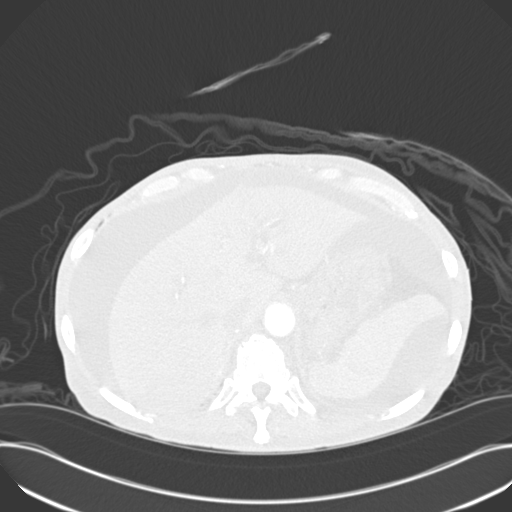
[im 26/101  lung]
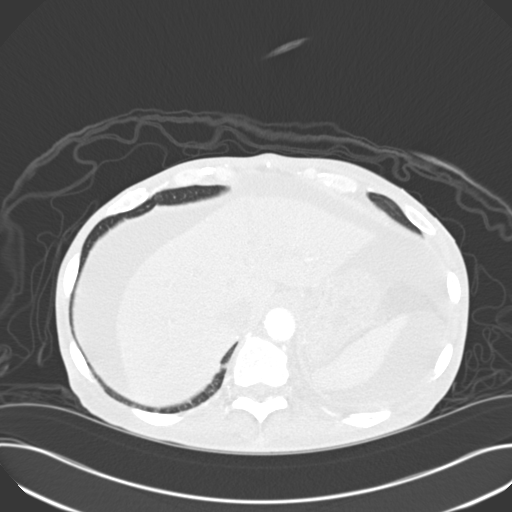
[im 34/101  mediastinal]
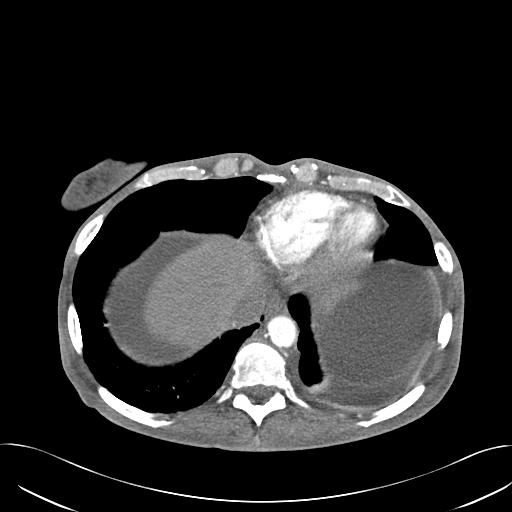
[im 34/101  lung]
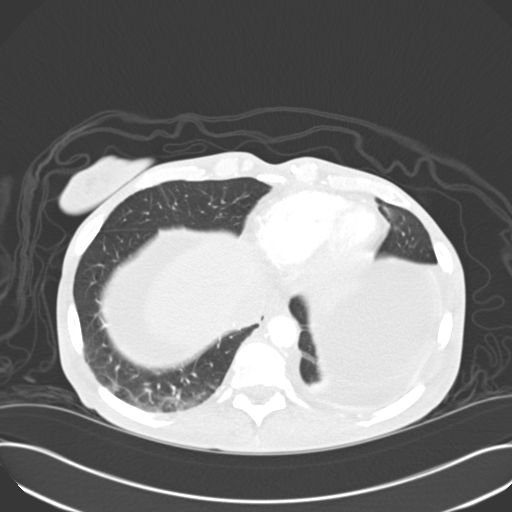
[im 41/101  lung]
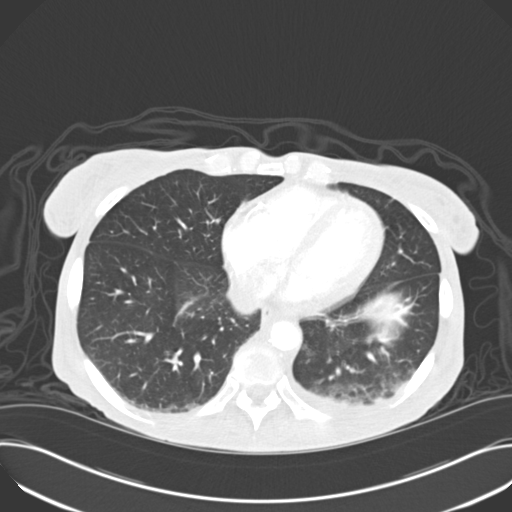
[im 48/101  lung]
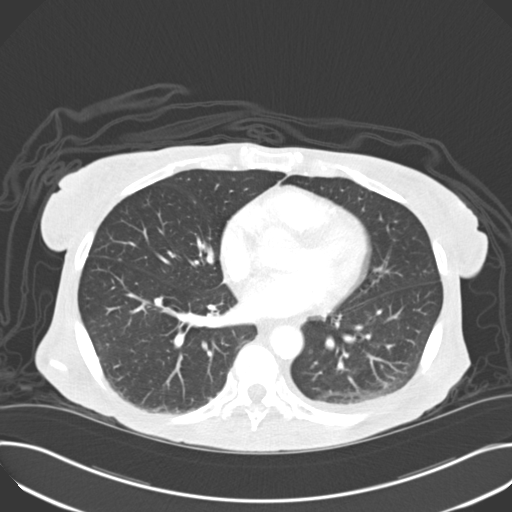
[im 52/101  lung]
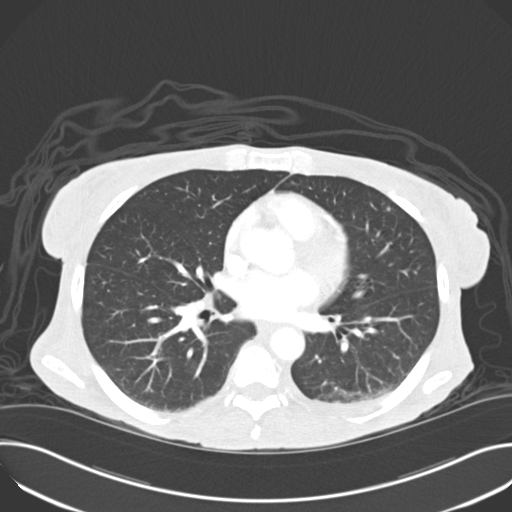
[im 56/101  mediastinal]
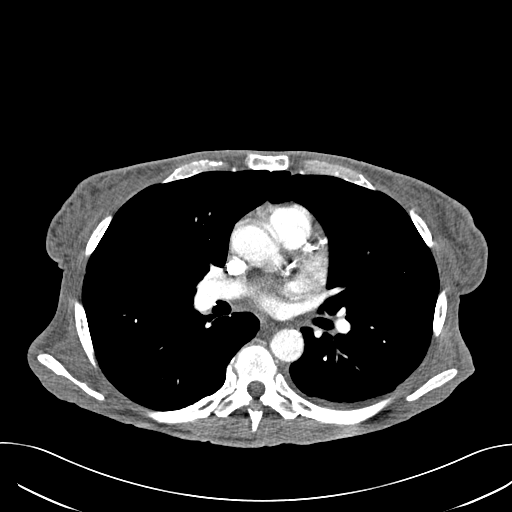
[im 56/101  lung]
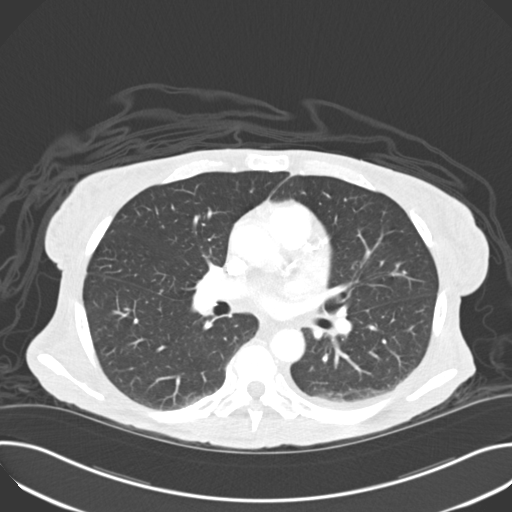
[im 61/101  lung]
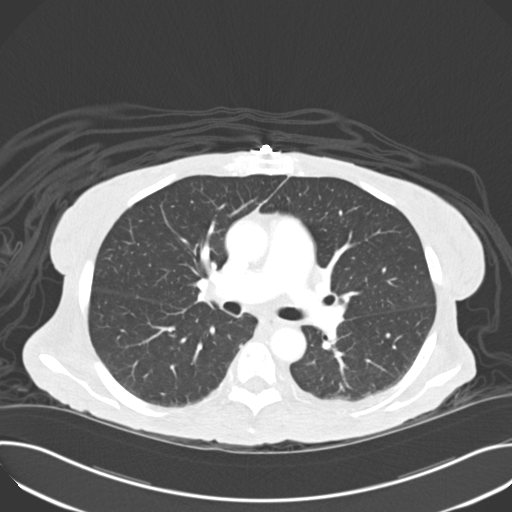
[im 67/101  lung]
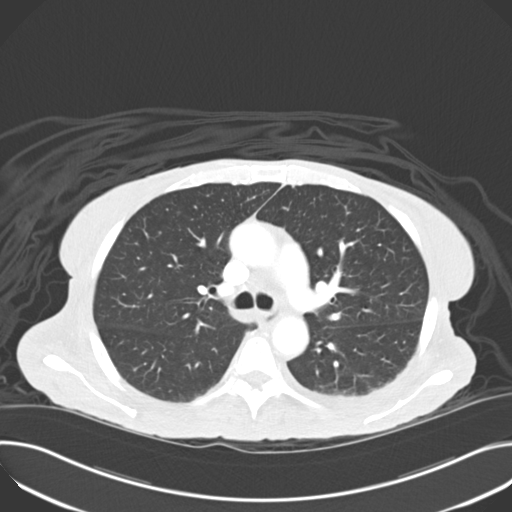
[im 75/101  lung]
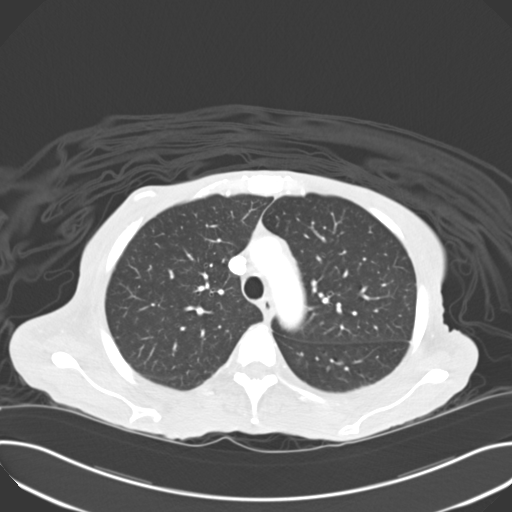
[im 81/101  mediastinal]
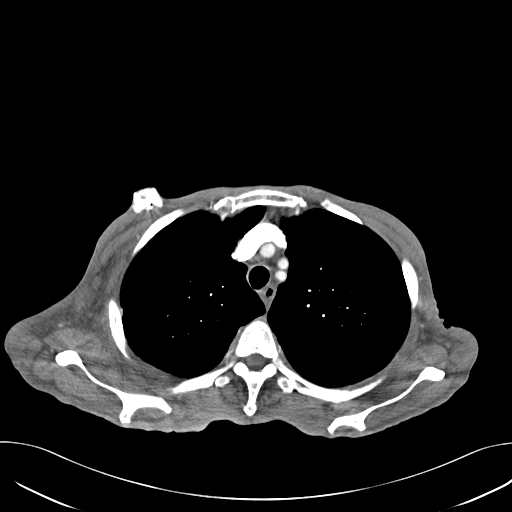
[im 81/101  lung]
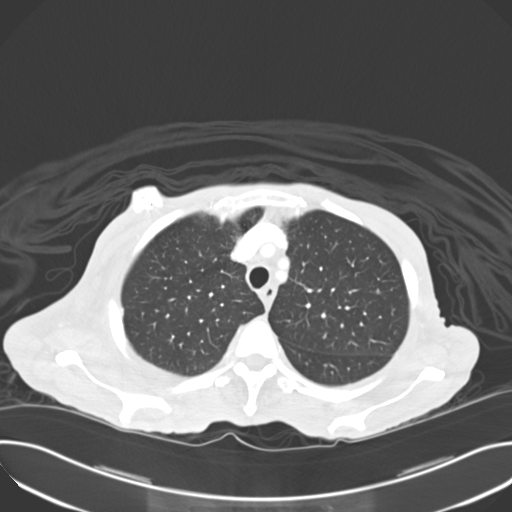
[im 86/101  lung]
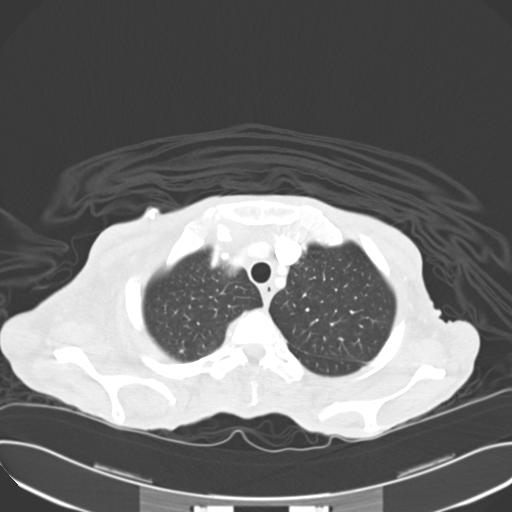
[im 93/101  lung]
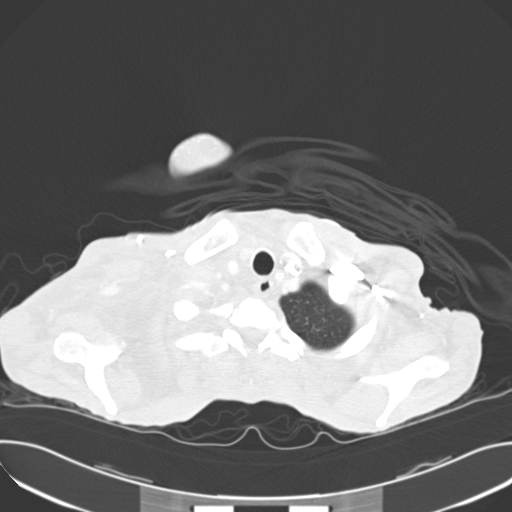

[15 of 33 positions shown; findings below may reference images not displayed]

PROCEDURE:     CT  - CT CHEST (FOR PE) W  - November 30, 2011  [DATE]

RESULT:     Axial CT scanning was performed through the chest with
reconstructions at 3 mm intervals and slice thicknesses following
intravenous administration of 85 cc of [id number]. Review of multiplanar
reconstructed images was performed separately on the VIA monitor.

Contrast within the pulmonary arterial tree is within the limits of normal.
There are no filling defects to suggest an acute pulmonary embolism. The
caliber of the thoracic aorta is normal. There is no evidence of a false
lumen. The cardiac silhouette is normal in size. There are coronary artery
calcifications. There is a small left pleural effusion. At lung window
settings there are no alveolar infiltrates. There is atelectasis at both
lung bases posteriorly. No suspicious appearing pulmonary masses are
demonstrated. There are scattered nodules in both lungs measuring up to 3 mm
in diameter. No bulky mediastinal or hilar lymph nodes are demonstrated.

Within the upper abdomen there is a large amount of ascites present. There
are no adrenal masses. There is generalized wasting of the subcutaneous fat
over the chest and upper abdomen.
IMPRESSION: 1. There is no evidence of an acute pulmonary embolism nor acute thoracic
aortic pathology.
2. There is no evidence of CHF.
3. There is bibasilar atelectasis and a small left pleural effusion. There
are multiple tiny nodules within both lungs measuring up to 3 mm in diameter
which may indicate metastatic disease.
4. There is a large amount of ascites present in the upper abdomen. The
visualized portions of the pancreas suggest a mass in the region of the
pancreatic head.

## 2013-08-20 IMAGING — US US GUIDE NEEDLE - US PARA
1 series · 4 of 4 positions shown · non-contrast
Comparison: none

REASON FOR EXAM: ascites
COMMENTS:

[Series 1: us guide needle - us para · 0.31mm/px · 4 of 4 slices shown]
[im 1/4]
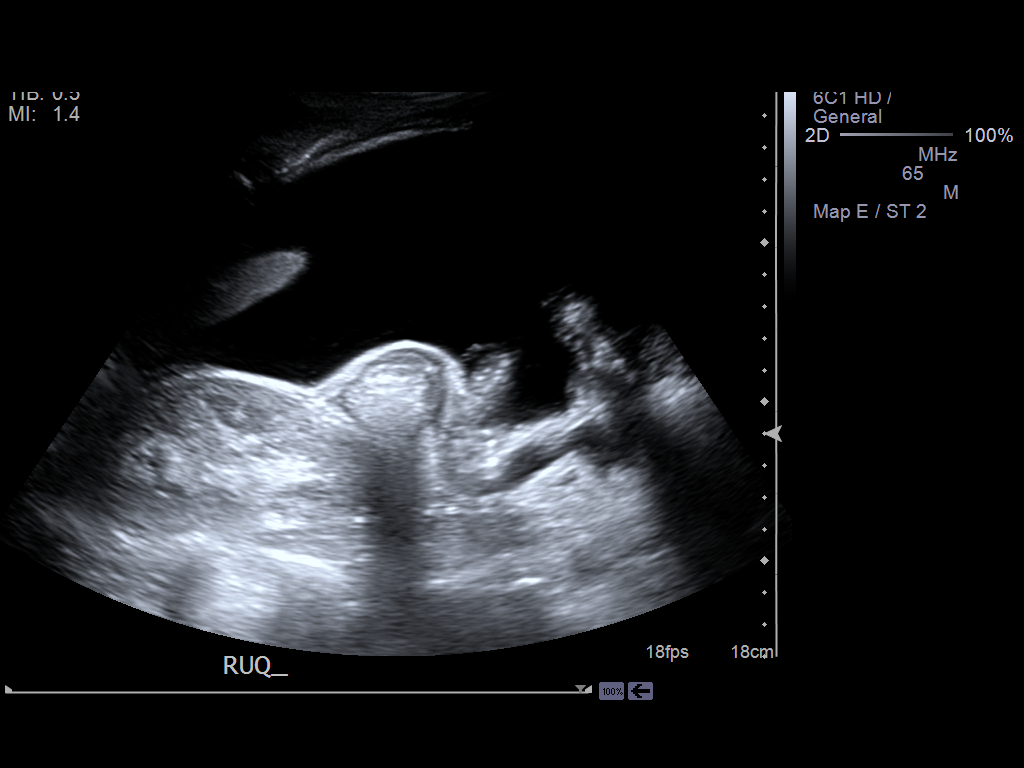
[im 2/4]
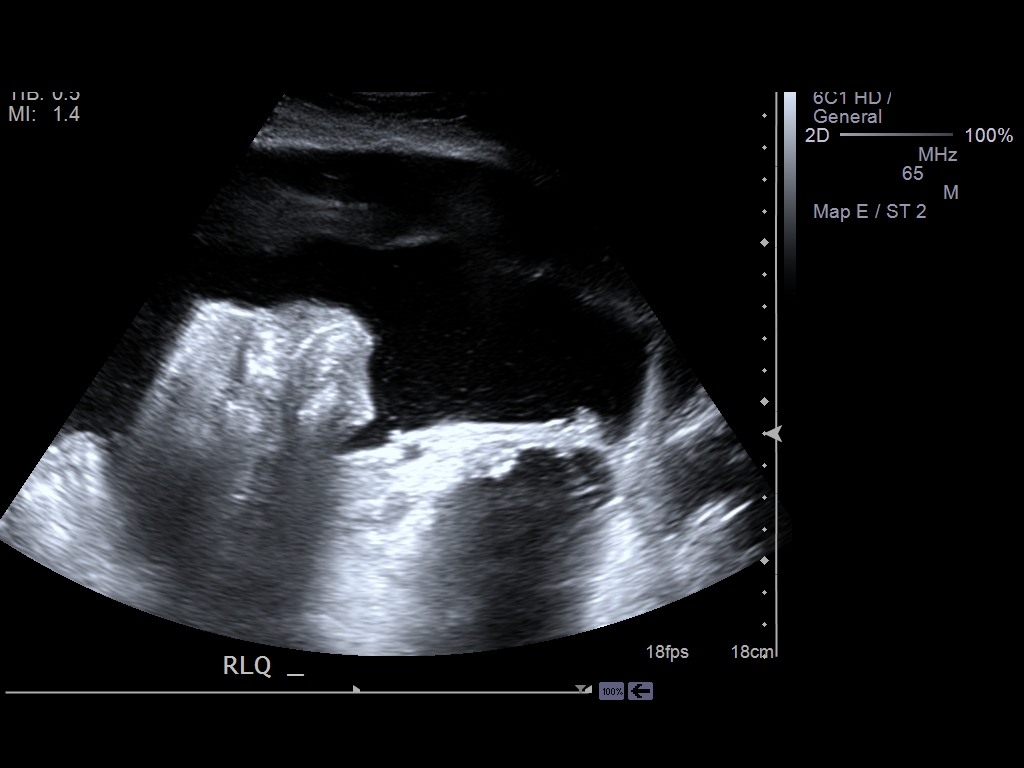
[im 3/4]
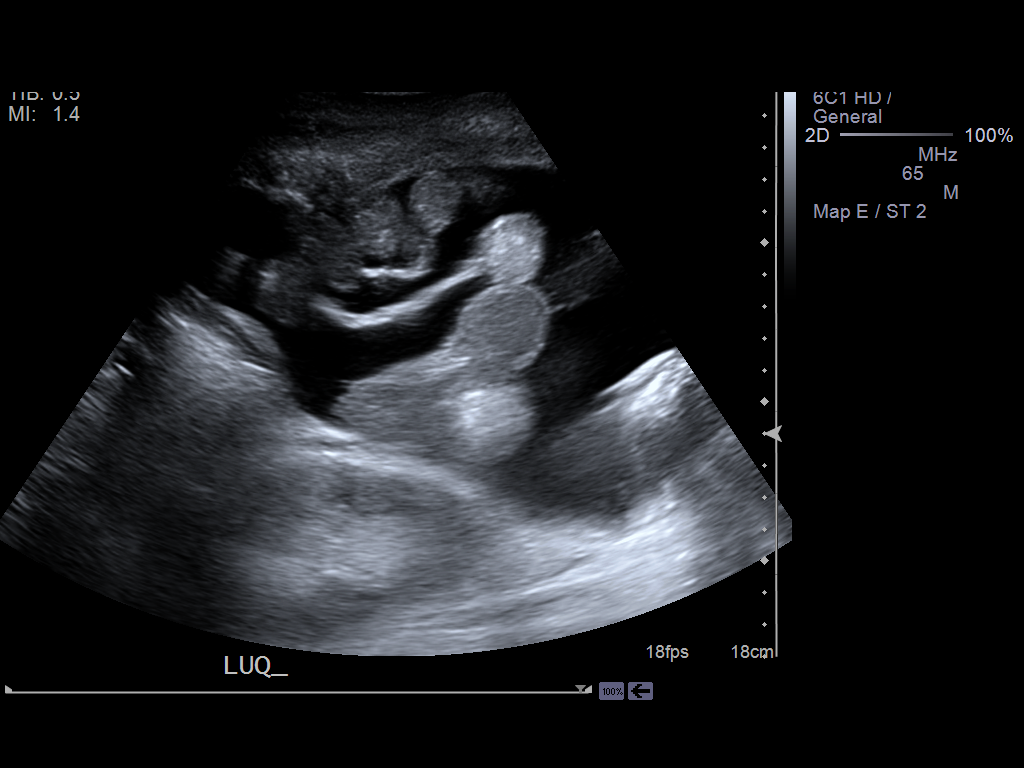
[im 4/4]
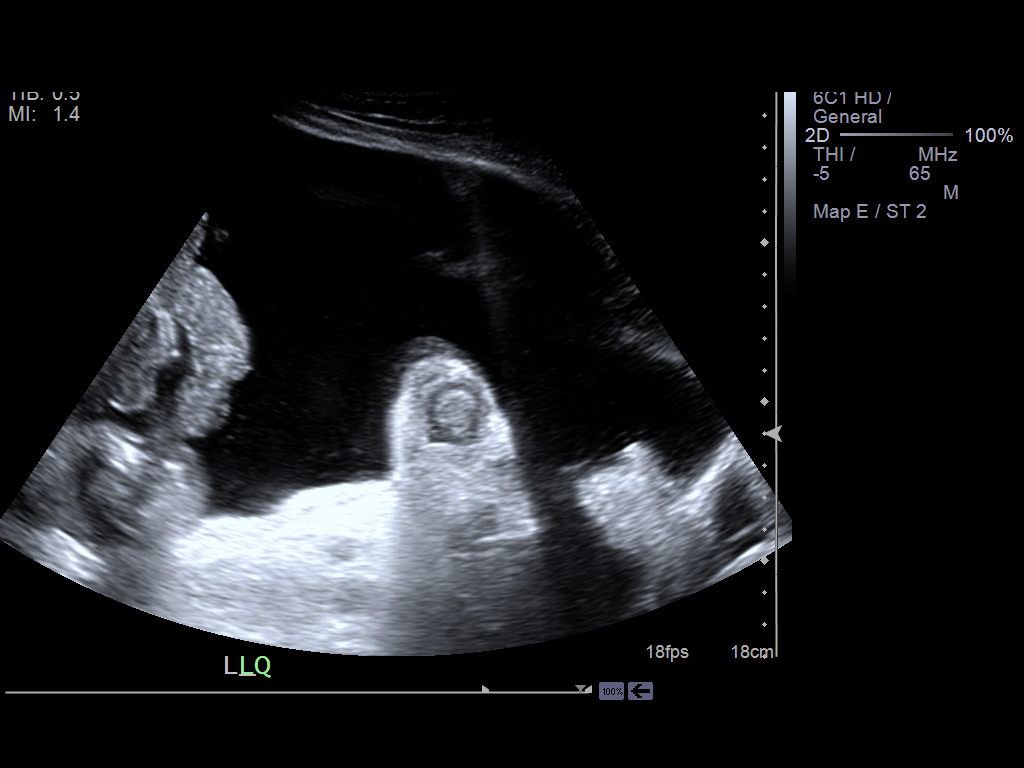

[4 of 4 positions shown; findings below may reference images not displayed]

PROCEDURE:     US  - US GUIDED PARACENTESIS  - January 08, 2012  [DATE]

RESULT:

Procedure: The patient was informed of the risks and benefits of the
procedure and proper informed consent was obtained. The patient was brought
to the Ultrasound Suite and placed in a supine position. The abdomen was
evaluated and a proper entry site for ultrasound-guided paracentesis was
established within the left lower quadrant. The overlying soft tissues were
then prepped in the usual sterile fashion. Utilizing 10 mL of 1% lidocaine
without epinephrine, the overlying soft tissues were anesthetized. A small
dermatomy was formed at the entry site. The peritoneal cavity was cannulated
with Keesha Bias paracentesis catheter. 3388 mL of straw-colored fluid was
removed from the peritoneal cavity. Once drainage ceased, the catheter was
removed. The patient tolerated the procedure without complications.
IMPRESSION: Ultrasound-guided paracentesis as described above.

## 2014-09-14 NOTE — Op Note (Signed)
PATIENT NAME:  Carla CookeyOCH, Sister M MR#:  [id number] DATE OF BIRTH:  12-18-1960  DATE OF PROCEDURE:  02/19/2012  PREOPERATIVE DIAGNOSIS: Metastatic pancreatic cancer with intractable ascites.   POSTOPERATIVE DIAGNOSIS: Metastatic pancreatic cancer with intractable ascites.   OPERATIVE PROCEDURE: Placement of peritoneal Pleurx catheter.   SURGEON: Donnalee CurryJeffrey Jullien Granquist, MD   ANESTHESIA: 5 mL local infiltration, general under Dr. Pernell DupreAdams.   ESTIMATED BLOOD LOSS: None.   CLINICAL NOTE: This 54 year old woman has developed intractable and recurrent ascites requiring paracenteses for relief of respiratory distress. A Pleurx catheter was requested by her treating oncologist. The patient received Kefzol prior to the procedure.   OPERATIVE NOTE: The patient was brought to the operating room and placed supine on the table. The abdomen was prepped with ChloraPrep and draped. Local anesthesia was infiltrated in the right lower quadrant after ultrasound confirmed a large area of ascites. The Pleurx catheter system was utilized and the needle introduced into the ascites followed by the guidewire and dilators. The catheter was tunneled from a medial-based incision and then placed into the peel-away sheath. 6200 mL of odorless ascites was removed. The wound was closed in layers with 3-0 Vicryl deep and a 4-0 Vicryl subcuticular suture for the skin. 2-0 silk was used to anchor the catheter at its exit  point. Gauze pads followed by an occlusive dressing was applied.   The patient tolerated the procedure well and was taken to the recovery room in stable condition. ____________________________ Earline MayotteJeffrey W. Lacole Komorowski, MD jwb:bjt D:  02/19/2012 08:32:49 ET          T: 02/19/2012 12:33:19 ET JOB#: [id number]  cc: Valarie ConesJanak K. Doylene Canninghoksi, MD Chaunce Winkels Brion AlimentW Arrow Emmerich MD ELECTRONICALLY SIGNED 02/21/2012 9:28

## 2014-09-14 NOTE — Consult Note (Signed)
Comments   I met with pt and her 2 sisters. Pt seems to have accepted that no further chemo is available for her and that she is approaching the end of life. She says that she wants to go home. Sisters say that pt's family will help take care of pt to allow her to stay in her home. I asked the pt if she would agree with hospice services in her home and she agrees. Hospice screen ordered.  fentanyl patch at lower dose (100mcg) with prn oxycodone. Will reevaluate pain in AM and adjust medication as needed. As family and hospice will be involved in pt's care, there is much less likelihood of pt taking too much medication.  for discharge tomorrow if ok with Dr Choksi, if hospice in place and if pain controlled.     Electronic Signatures: Phifer, Nancy (MD)  (Signed 31-Jul-13 15:04)  Authored: Palliative Care   Last Updated: 31-Jul-13 15:04 by Phifer, Nancy (MD) 

## 2014-09-14 NOTE — Discharge Summary (Signed)
PATIENT NAME:  Carla Harrison, Carla Harrison MR#:  [id number] DATE OF BIRTH:  03-22-1961  DATE OF ADMISSION:  12/25/2011 DATE OF DISCHARGE:  12/28/2011  FINAL DIAGNOSES:  1. Altered mental status, possibility of underlying infection, versus overuse of pain killer. 2. Pancytopenia secondary to chemotherapy.  3. Moderate malnutrition secondary to poor oral intake with progressing malignancy.  4. 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   CONDITION ON DISCHARGE: Overall condition and prognosis is guarded because of underlying disease.   DISCHARGE MEDICATIONS: 1. Percocet 7.5 mg/325 mg one or two tablets every 4 to 6 hours p.r.n. for pain. 2. Phenergan 25 mg p.o. four times daily p.r.n. for nausea.  3. Protonix 40 mg p.o. bid. 4. Fentanyl patch 100 mcg every three days.   DIET: As tolerated.   ACTIVITY: As tolerated.   DISPOSITION: The patient was transferred to hospice facility. Overall prognosis is poor.   HISTORY OF PRESENT ILLNESS: Carla Harrison was brought in with altered mental status. The patient was poorly responsive. The patient was also myelosuppressed, declining condition, and increasing abdominal distention. In view of all of these findings, the patient was admitted in the hospital for further evaluation and treatment consideration. For detailed past history, social and family history it has been dictated and noted on the chart.   AVAILABLE LAB DATA: Glucose 154. Creatinine 0.72. Glucose was 44 on admission. White count on admission was 0.3, hemoglobin 8.0, and platelet count 36,000. White count dropped, on 12/26/2011, dropped to 0.5 and hemoglobin 7.7, on 12/26/2011. Then white count was 1000 on 12/27/2011 and platelet count was 19,000.   HOSPITAL COURSE: During the hospital stay, the patient was started on IV antibiotics, blood cultures were obtained. Palliative care consult was obtained.   The patient gradually became much more alert. The patient initially was taken  off all pain medication and gradually reduced dose and fentanyl patch was started. Gradually the patient's condition improved, platelet count remained low and platelet transfusion was done. After a prolonged discussion with the family and palliative care team, it was decided that the patient probably is an appropriate candidate for hospice home and the patient was then transferred to the [facility] for end-of-life care.  ____________________________ Gerome SamJanak K. Makeila Yamaguchi, MD jkc:slb D: 01/07/2012 08:43:06 ET T: 01/07/2012 14:41:36 ET JOB#: [id number]  cc: Valarie ConesJanak K. Orval Dortch, MD, <Dictator> [facility] Laddie AquasJANAK K Jerald Hennington MD ELECTRONICALLY SIGNED 01/07/2012 22:01

## 2014-09-19 NOTE — Discharge Summary (Signed)
PATIENT NAME:  Carla Harrison, [name] MR#:  [id number] DATE OF BIRTH:  28-Jul-1960  DATE OF ADMISSION:  11/30/2011 DATE OF DISCHARGE:  12/01/2011  PRESENTING COMPLAINT: Weakness, inability to eat, poor appetite.   DISCHARGE DIAGNOSES:  1. Failure to thrive.  2. Generalized weakness with leg pain and abdominal pain.  3. Pancreatic cancer, on chemotherapy.  CONDITION ON DISCHARGE: Fair.   MEDICATIONS:  1. Duragesic patch 125 mcg every 72 hours.  2. Acetaminophen/oxycodone 325/7.5, 1-2 q. 4-6 hours p.r.n. as needed.  3. Promethazine 25 mg, 1 tablet every 6 hours as needed.  4. Protonix 40 mg daily.  Lasix and Spironolactone are held until you see Dr. Doylene Canninghoksi on your next visit.   DIET: Regular.   FOLLOWUP: Follow up with Dr. Doylene Canninghoksi on 07/08 on your scheduled visit.   [facility] services arranged.  CONSULTANTS:  Dr. Orlie DakinFinnegan.   LABORATORY, DIAGNOSTIC, AND RADIOLOGICAL DATA: CT of the chest showed no acute pulmonary embolism, no congestive heart failure. There is bibasilar atelectasis and left pleural effusion. Multiple tiny nodules in both lungs measuring up to 3-mm diameter, which may indicate metastatic disease. There is a large amount of ascites present in the upper abdomen. Mass in the region of the pancreatic head. Hemoglobin and hematocrit 8.9 and 27.6. White count is 10.5. Glucose is 52, creatinine 1, sodium 141, potassium is 3.1, chloride 110. Alkaline phosphatase 575, SGOT is 74. Cardiac enzymes negative.  BRIEF SUMMARY OF HOSPITAL COURSE: Carla Harrison is a 54 year old African American female with past medical history of pancreatic cancer with metastasis to the liver, [facility]. She had her chemo this past Monday and had become weak, not able to get around, with generalized weakness and leg pain. She was brought to the Emergency Room, was started on IV fluids and admitted to the oncology floor where she perked up some and had a good breakfast prior to her being  discharged home. She had been placed on her Duragesic patch and her p.o. narcotics were prescribed. She will follow up with Dr. Doylene Canninghoksi on 07/08 in the morning. [facility] services were arranged to get some more help with the patient at home.  1. Pancreatic cancer with metastasis to the liver, now possible metastasis to the lungs according to the CT that shows tiny nodules. The patient is [facility] per Dr. Doylene Canninghoksi. The patient was seen by Dr. Orlie DakinFinnegan, who agrees with the plan.  2. Chronic pain due to cancer. She is on Duragesic patch and p.r.n. Percocet.  3. Tobacco abuse counseling done but the patient is not motivated to stop smoking.  4. 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 There was no indication for transfusion.  5. Deep vein thrombosis prophylaxis with heparin.   Hospital stay otherwise remained stable. The patient will follow up with Dr. Doylene Canninghoksi as outpatient.   TIME SPENT: 40 minutes.    ____________________________ Wylie HailSona A. Allena KatzPatel, MD sap:bjt D: 12/02/2011 11:55:24 ET T: 12/03/2011 15:05:12 ET JOB#: [id number]  cc: [place] Welcome A. Allena KatzPatel, MD, <Dictator> Gerome SamJanak K. Doylene Canninghoksi, MD Willow OraSONA A Lurleen Soltero MD ELECTRONICALLY SIGNED 12/13/2011 13:46

## 2014-09-19 NOTE — Consult Note (Signed)
Patient admitted overnight with poor PO intake and increasing weakness and fatigue.  Her symptoms have significantly improved overnight.  She last received chemotherapy on November 26, 2011 using gemcitabine and Abraxane.  Since patient's symptoms have improved, she and her family are requesting discharge home today with home health.  Agree with [facility].  Please be sure patient keeps her previously scheduled followup appointment with Dr. Doylene Canninghoksi on December 03, 2011 at 2:15 PM for consideration of her next treatment.  with questions.  Electronic Signatures: Gerarda FractionFinnegan, Timothy (MD)  (Signed on 06-Jul-13 11:26)  Authored  Last Updated: 06-Jul-13 11:26 by Gerarda FractionFinnegan, Timothy (MD)

## 2014-09-19 NOTE — H&P (Signed)
PATIENT NAME:  Carla Harrison, Carla Harrison MR#:  [id number] DATE OF BIRTH:  [birth year]  DATE OF ADMISSION:  11/30/2011  PRIMARY CARE PHYSICIAN: None.   PRIMARY ONCOLOGIST: Johney MaineJanak Choksi, MD    CHIEF COMPLAINT: Generalized weakness, poor appetite, and unable to get around the house secondary to leg pain. Carla Harrison is a 54 year old African American female with past medical history of pancreatic cancer which has metastasized to the liver, currently getting palliative chemotherapy at the [facility] Center. Her last chemotherapy was July 1st. Per family, usually the patient perks up in about 1 or 2 days; however, this time it has lingered long, and the patient has not been able to walk around in the house to go to the kitchen to fix her meals due to persistent leg pain and abdominal pain. She has been on Duragesic patch; however, they are not able to locate her patches and she has been off the patches for the past couple of days. The patient has not eaten well in the last few days. She has been also refusing family to come over to help her, and she would "lock herself up in the house" not allowing family members to come help. The family got worried and brought her to the Emergency Room, where she is hemodynamically stable. She did receive a dose of morphine. She did receive a dose of IV pain medicine earlier.   REVIEW OF SYSTEMS: CONSTITUTIONAL: No fever. Positive for fatigue, weakness, and abdominal pain and leg pain. EYES: No blurred or double vision or glaucoma. ENT: No tinnitus, ear pain, hearing loss. RESPIRATORY: No cough, wheeze, hemoptysis. CARDIOVASCULAR: No chest pain, orthopnea or edema. GASTROINTESTINAL: Positive for some abdominal pain. GU: No dysuria or hematuria. ENDOCRINE: No polyuria or nocturia. HEMATOLOGY: Positive for chronic anemia. SKIN: No acne or rash. NEUROLOGIC: No CVA or transient ischemic attack. PSYCH: No anxiety or depression. All other systems reviewed are negative.   ALLERGIES: No known drug  allergies.   MEDICATIONS:  1. Duragesic patch 125 mcg topical every 72 hours.  2. Acetaminophen/oxycodone 325/7.5, 1 tablet every 4 to 6 hours p.r.n.  3. Lasix 20 mg, 2 tablets b.i.d.  4. Spironolactone 50 mg, 2 tablets b.i.d.  5. Promethazine 25 mg, 1 tablet every 6 hours.  6. Protonix 40 mg daily.   PAST MEDICAL HISTORY:  1. Anemia of chronic disease.  2. Pancreatic cancer with metastasis to the liver.  3. Chronic smoker.  4. Chronic back pain.  5. Ascites noted on CAT scan, suspected from pancreatic cancer.  6. History of alcohol abuse.  7. History of genital herpes.  8. Gastroesophageal reflux disease.  9. Port-A-Cath placement in September of 2011.  10. Tubal ligation in 1998.   SOCIAL HISTORY: The patient continues to smoke about a pack a day. She denies alcohol use. She lives by herself.   FAMILY HISTORY: Father with lung cancer.   PHYSICAL EXAMINATION:  GENERAL: The patient is awake, alert, oriented x3, mild-to-moderate distress due to abdominal pain and leg pain.   VITAL SIGNS: Afebrile, pulse is 103, respirations 18, blood pressure is 103/65, saturations are 92% on 2 liters.   HEENT: Atraumatic, normocephalic. Pupils are equal, round, and reactive to light and accommodation. Extraocular movements intact. Oral mucosa is moist.   NECK: Supple. No JVD. No carotid bruits.   RESPIRATORY: Clear to auscultation bilaterally. Decreased breath sounds in the bases. No rales, rhonchi, respiratory distress, or labored breathing.   HEART: Both the heart sounds are normal. Rate, rhythm is regular. PMI  is not lateralized. Chest is nontender.   EXTREMITIES: Good pedal pulses, good femoral pulses. No lower extremity edema.   ABDOMEN: Abdomen is distended. There is tenderness present generalized all over. No guarding or rigidity.   NEUROLOGIC: Grossly intact cranial nerves II through showed 12. No other focal deficits.   PSYCHIATRIC: The patient is awake, alert, oriented x3.    LABORATORY, DIAGNOSTIC AND RADIOLOGICAL DATA:  CT of the chest shows no evidence of pulmonary embolus. There is no evidence of congestive heart failure. Bibasilar atelectasis and small pleural effusion. There are multiple tiny nodules within both lungs measuring up to 3 mm in diameter which may indicate metastatic disease. A large amount of ascites is present in the upper abdomen. There is a pancreatic mass in the region of the pancreatic head.  White count 10.5, hemoglobin and hematocrit is 8.9 and 27.6, platelet count is 185.  Glucose is 52, BUN 10, creatinine 1, sodium is 141, potassium 3.1, chloride is 110, SGOT 74, SGPT is 30.  Troponin is 0.02.  EKG shows normal sinus rhythm.   ASSESSMENT/PLAN: Carla Harrison is a 54 year old with:  1. Failure to thrive, poor appetite, unable to walk due to pain, not eating for a couple of days after getting chemotherapy on July 1st: We will admit the patient to Oncology floor, continue IV fluids. We will ask a dietitian to come over and work with the patient as far as p.o. intake and nutrition. .  2. Hospice screening will be done. The patient's family is asking for help at home given the patient is not allowing on the majority of the days for the patient's family members to get in the house and help her.  3. Pancreatic cancer with metastasis to liver, now possible metastasis to the lungs, according to the CT that shows tiny nodules bilaterally. The patient is undergoing palliative chemotherapy at the [facility] per Dr. Doylene Canning. We will have Oncology see the patient.  4. Chronic pain due to cancer. The patient is on Duragesic patch. We will start IV morphine. The patient may benefit from Roxanol. We will have Hospice screening per family request.   5. Tobacco abuse: Counseling done, about three minutes spent. The patient appears to be not much motivated to stop smoking.  6. Chronic anemia: Due to chemotherapy and cancer.   7. Deep vein thrombosis prophylaxis  with heparin subcutaneous t.i.d.   Further work-up according to the patient's clinical course. The hospital admission plan was discussed with the patient. Care Management will be helping out in discharge planning  TIME SPENT:   50 minutes. ____________________________ Wylie Hail Allena Katz, MD sap:cbb D: 11/30/2011 18:29:37 ET T: 12/01/2011 09:44:53 ET JOB#: [id number]  cc: Devanie Galanti A. Allena Katz, MD, <Dictator> Gerome Sam. Doylene Canning, MD Willow Ora MD ELECTRONICALLY SIGNED 12/13/2011 13:46
